# Patient Record
Sex: Female | Born: 1970 | Race: White | Hispanic: No | Marital: Married | Smoking: Former smoker
Health system: Southern US, Community
[De-identification: ages and names within clinical notes are randomized; demographics above are authoritative.]

## PROBLEM LIST (undated history)

## (undated) DIAGNOSIS — I10 Essential (primary) hypertension: Secondary | ICD-10-CM

## (undated) DIAGNOSIS — E663 Overweight: Secondary | ICD-10-CM

## (undated) DIAGNOSIS — E041 Nontoxic single thyroid nodule: Secondary | ICD-10-CM

## (undated) DIAGNOSIS — I4891 Unspecified atrial fibrillation: Secondary | ICD-10-CM

## (undated) DIAGNOSIS — I499 Cardiac arrhythmia, unspecified: Secondary | ICD-10-CM

## (undated) HISTORY — DX: Cardiac arrhythmia, unspecified: I49.9

## (undated) HISTORY — DX: Essential (primary) hypertension: I10

## (undated) HISTORY — DX: Overweight: E66.3

## (undated) HISTORY — DX: Nontoxic single thyroid nodule: E04.1

## (undated) HISTORY — PX: NO PAST SURGERIES: SHX2092

## (undated) HISTORY — DX: Unspecified atrial fibrillation: I48.91

---

## 2013-10-09 DIAGNOSIS — E041 Nontoxic single thyroid nodule: Secondary | ICD-10-CM

## 2013-10-09 HISTORY — DX: Nontoxic single thyroid nodule: E04.1

## 2013-11-07 ENCOUNTER — Ambulatory Visit: Payer: Self-pay | Admitting: Family Medicine

## 2014-01-27 LAB — HM PAP SMEAR: HM PAP: NORMAL

## 2014-11-09 ENCOUNTER — Encounter: Payer: Self-pay | Admitting: Family Medicine

## 2014-11-09 ENCOUNTER — Ambulatory Visit (INDEPENDENT_AMBULATORY_CARE_PROVIDER_SITE_OTHER): Payer: Managed Care, Other (non HMO) | Admitting: Family Medicine

## 2014-11-09 VITALS — BP 132/94 | HR 73 | Temp 98.8°F | Ht 68.0 in | Wt 192.0 lb

## 2014-11-09 DIAGNOSIS — E663 Overweight: Secondary | ICD-10-CM | POA: Diagnosis not present

## 2014-11-09 DIAGNOSIS — Z0000 Encounter for general adult medical examination without abnormal findings: Secondary | ICD-10-CM

## 2014-11-09 DIAGNOSIS — I10 Essential (primary) hypertension: Secondary | ICD-10-CM | POA: Diagnosis not present

## 2014-11-09 MED ORDER — HYDROCHLOROTHIAZIDE 12.5 MG PO CAPS: 12.5000 mg | ORAL_CAPSULE | Freq: Every day | ORAL | Status: DC

## 2014-11-09 NOTE — Progress Notes (Signed)
BP 132/94 mmHg  Pulse 73  Temp(Src) 98.8 F (37.1 C)  Ht 5\' 8"  (1.727 m)  Wt 192 lb (87.091 kg)  BMI 29.20 kg/m2  SpO2 99%  LMP 10/12/2014 (Approximate)   Subjective:    Patient ID: Courtney Walsh, female    DOB: [birth year], 44 y.o.   MRN: [id number]  HPI: Courtney Walsh is a 44 y.o. female  Chief Complaint  Patient presents with  . Form    Needs form for Biometric Screening.   She needs numbers for biometric screening The CMA did the waist circumference She just needs the information and she'll fill it out (she does not have a form here for me to sign) She is not fasting; had salad with raspberry dressing and string cheese and crackers She is trying to lose weight  Her BP is high today; she does check it at home; she has been out of her BP medicine for a while; she knew it would go up at the doctor's; her usual numbers are high 120s on top and high 80s or low 90s on the bottom Not taking allergy medicine; no much salt It's been a month since she has been on her medicine, just never made it in; we'll get that refill today She sees GYN for paps On her feet all day at work; has a movement monitor on wrist Not interested in flu shots  Relevant past medical, surgical, family and social history reviewed and updated as indicated. Interim medical history since our last visit reviewed. Allergies and medications reviewed and updated.  Review of Systems  Respiratory: Negative for shortness of breath.   Cardiovascular: Negative for chest pain and leg swelling.  Per HPI unless specifically indicated above     Objective:    BP 132/94 mmHg  Pulse 73  Temp(Src) 98.8 F (37.1 C)  Ht 5\' 8"  (1.727 m)  Wt 192 lb (87.091 kg)  BMI 29.20 kg/m2  SpO2 99%  LMP 10/12/2014 (Approximate)  Wt Readings from Last 3 Encounters:  11/09/14 192 lb (87.091 kg)  11/04/13 195 lb (88.451 kg)  BP recheck 132/94  Today's Vitals   11/09/14 1534 11/09/14 1608  BP: 152/93 132/94  Pulse: 76 73   Temp: 98.8 F (37.1 C)   Height: 5\' 8"  (1.727 m)   Weight: 192 lb (87.091 kg)   SpO2: 99%     Physical Exam  Constitutional: She appears well-developed and well-nourished.  overweight  HENT:  Mouth/Throat: Mucous membranes are normal.  Eyes: EOM are normal. No scleral icterus.  Cardiovascular: Normal rate and regular rhythm.   Pulmonary/Chest: Effort normal and breath sounds normal.  Psychiatric: She has a normal mood and affect. Her behavior is normal.      Assessment & Plan:   Problem List Items Addressed This Visit      Cardiovascular and Mediastinum   Essential hypertension, benign - Primary    DASH guidelines encouraged; modest weight loss; return in 4 weeks for recheck BP; see AVS      Relevant Medications   hydrochlorothiazide (MICROZIDE) 12.5 MG capsule     Other   Overweight (BMI 25.0-29.9)    Modest weight loss encouraged; see AVS       Other Visit Diagnoses    Preventative health care        labs ordered for biometric screening for work    Relevant Orders    Lipid Panel w/o Chol/HDL Ratio (Completed)    Comprehensive metabolic panel (Completed)  Follow up plan: Return in about 4 weeks (around 12/07/2014) for blood pressure.  She refused flu shot today  Meds ordered this encounter  Medications  . hydrochlorothiazide (MICROZIDE) 12.5 MG capsule    Sig: Take 1 capsule (12.5 mg total) by mouth daily.    Dispense:  30 capsule    Refill:  0   An after-visit summary was printed and given to the patient at check-out.  Please see the patient instructions which may contain other information and recommendations beyond what is mentioned above in the assessment and plan.

## 2014-11-09 NOTE — Patient Instructions (Addendum)
Your goal blood pressure is less than 140 mmHg on top and under 90 on the bottom Try to follow the DASH guidelines (DASH stands for Dietary Approaches to Stop Hypertension) Try to limit the sodium in your diet.  Ideally, consume less than 1.5 grams (less than 1,500mg ) per day. Do not add salt when cooking or at the table.  Check the sodium amount on labels when shopping, and choose items lower in sodium when given a choice. Avoid or limit foods that already contain a lot of sodium. Eat a diet rich in fruits and vegetables and whole grains.  Check out the information at familydoctor.org entitled "What It Takes to Lose Weight" Try to lose between 1-2 pounds per week by taking in fewer calories and burning off more calories You can succeed by limiting portions, limiting foods dense in calories and fat, becoming more active, and drinking 8 glasses of water a day (64 ounces) Don't skip meals, especially breakfast, as skipping meals may alter your metabolism Do not use over-the-counter weight loss pills or gimmicks that claim rapid weight loss A healthy BMI (or body mass index) is between 18.5 and 24.9 You can calculate your ideal BMI at the NIH website JobE[employer].huhttp://www.nhlbi.nih.gov/health/educational/lose_wt/BMI/bmicalc.htm  I do recommend yearly flu shots; for individuals who don't want flu shots, try to practice excellent hand hygiene, and avoid nursing homes, day cares, and hospitals during peak flu season; taking vitamin C daily during flu/cold season may help boost your immune system too   DASH Eating Plan DASH stands for "Dietary Approaches to Stop Hypertension." The DASH eating plan is a healthy eating plan that has been shown to reduce high blood pressure (hypertension). Additional health benefits may include reducing the risk of type 2 diabetes mellitus, heart disease, and stroke. The DASH eating plan may also help with weight loss. WHAT DO I NEED TO KNOW ABOUT THE DASH EATING PLAN? For the DASH  eating plan, you will follow these general guidelines:  Choose foods with a percent daily value for sodium of less than 5% (as listed on the food label).  Use salt-free seasonings or herbs instead of table salt or sea salt.  Check with your health care provider or pharmacist before using salt substitutes.  Eat lower-sodium products, often labeled as "lower sodium" or "no salt added."  Eat fresh foods.  Eat more vegetables, fruits, and low-fat dairy products.  Choose whole grains. Look for the word "whole" as the first word in the ingredient list.  Choose fish and skinless chicken or [place] more often than red meat. Limit fish, poultry, and meat to 6 oz (170 g) each day.  Limit sweets, desserts, sugars, and sugary drinks.  Choose heart-healthy fats.  Limit cheese to 1 oz (28 g) per day.  Eat more home-cooked food and less restaurant, buffet, and fast food.  Limit fried foods.  Cook foods using methods other than frying.  Limit canned vegetables. If you do use them, rinse them well to decrease the sodium.  When eating at a restaurant, ask that your food be prepared with less salt, or no salt if possible. WHAT FOODS CAN I EAT? Seek help from a dietitian for individual calorie needs. Grains Whole grain or whole wheat bread. Brown rice. Whole grain or whole wheat pasta. Quinoa, bulgur, and whole grain cereals. Low-sodium cereals. Corn or whole wheat flour tortillas. Whole grain cornbread. Whole grain crackers. Low-sodium crackers. Vegetables Fresh or frozen vegetables (raw, steamed, roasted, or grilled). Low-sodium or reduced-sodium tomato and vegetable  juices. Low-sodium or reduced-sodium tomato sauce and paste. Low-sodium or reduced-sodium canned vegetables.  Fruits All fresh, canned (in natural juice), or frozen fruits. Meat and Other Protein Products Ground beef (85% or leaner), grass-fed beef, or beef trimmed of fat. Skinless chicken or [place]. Ground chicken or [place]. Pork  trimmed of fat. All fish and seafood. Eggs. Dried beans, peas, or lentils. Unsalted nuts and seeds. Unsalted canned beans. Dairy Low-fat dairy products, such as skim or 1% milk, 2% or reduced-fat cheeses, low-fat ricotta or cottage cheese, or plain low-fat yogurt. Low-sodium or reduced-sodium cheeses. Fats and Oils Tub margarines without trans fats. Light or reduced-fat mayonnaise and salad dressings (reduced sodium). Avocado. Safflower, olive, or canola oils. Natural peanut or almond butter. Other Unsalted popcorn and pretzels. The items listed above may not be a complete list of recommended foods or beverages. Contact your dietitian for more options. WHAT FOODS ARE NOT RECOMMENDED? Grains White bread. White pasta. White rice. Refined cornbread. Bagels and croissants. Crackers that contain trans fat. Vegetables Creamed or fried vegetables. Vegetables in a cheese sauce. Regular canned vegetables. Regular canned tomato sauce and paste. Regular tomato and vegetable juices. Fruits Dried fruits. Canned fruit in light or heavy syrup. Fruit juice. Meat and Other Protein Products Fatty cuts of meat. Ribs, chicken wings, bacon, sausage, bologna, salami, chitterlings, fatback, hot dogs, bratwurst, and packaged luncheon meats. Salted nuts and seeds. Canned beans with salt. Dairy Whole or 2% milk, cream, half-and-half, and cream cheese. Whole-fat or sweetened yogurt. Full-fat cheeses or blue cheese. Nondairy creamers and whipped toppings. Processed cheese, cheese spreads, or cheese curds. Condiments Onion and garlic salt, seasoned salt, table salt, and sea salt. Canned and packaged gravies. Worcestershire sauce. Tartar sauce. Barbecue sauce. Teriyaki sauce. Soy sauce, including reduced sodium. Steak sauce. Fish sauce. Oyster sauce. Cocktail sauce. Horseradish. Ketchup and mustard. Meat flavorings and tenderizers. Bouillon cubes. Hot sauce. Tabasco sauce. Marinades. Taco seasonings. Relishes. Fats and  Oils Butter, stick margarine, lard, shortening, ghee, and bacon fat. Coconut, palm kernel, or palm oils. Regular salad dressings. Other Pickles and olives. Salted popcorn and pretzels. The items listed above may not be a complete list of foods and beverages to avoid. Contact your dietitian for more information. WHERE CAN I FIND MORE INFORMATION? National Heart, Lung, and Blood Institute: CablePromo.it   This information is not intended to replace advice given to you by your health care provider. Make sure you discuss any questions you have with your health care provider.   Document Released: 12/15/2010 Document Revised: 01/16/2014 Document Reviewed: 10/30/2012 Elsevier Interactive Patient Education [employer]

## 2014-11-10 ENCOUNTER — Encounter: Payer: Self-pay | Admitting: Family Medicine

## 2014-11-10 LAB — LIPID PANEL W/O CHOL/HDL RATIO
Cholesterol, Total: 172 mg/dL (ref 100–199)
HDL: 41 mg/dL (ref >39)
LDL CALC: 100 mg/dL — AB (ref 0–99)
TRIGLYCERIDES: 156 mg/dL — AB (ref 0–149)
VLDL Cholesterol Cal: 31 mg/dL (ref 5–40)

## 2014-11-10 LAB — COMPREHENSIVE METABOLIC PANEL
ALK PHOS: 105 IU/L (ref 39–117)
ALT: 18 IU/L (ref 0–32)
AST: 14 IU/L (ref 0–40)
Albumin/Globulin Ratio: 1.5 (ref 1.1–2.5)
Albumin: 4.3 g/dL (ref 3.5–5.5)
BILIRUBIN TOTAL: 0.4 mg/dL (ref 0.0–1.2)
BUN/Creatinine Ratio: 18 (ref 9–23)
BUN: 10 mg/dL (ref 6–24)
CHLORIDE: 102 mmol/L (ref 97–106)
CO2: 23 mmol/L (ref 18–29)
Calcium: 9.6 mg/dL (ref 8.7–10.2)
Creatinine, Ser: 0.57 mg/dL (ref 0.57–1.00)
GFR calc Af Amer: 129 mL/min/{1.73_m2} (ref >59)
GFR calc non Af Amer: 112 mL/min/{1.73_m2} (ref >59)
GLUCOSE: 81 mg/dL (ref 65–99)
Globulin, Total: 2.9 g/dL (ref 1.5–4.5)
Potassium: 4.6 mmol/L (ref 3.5–5.2)
Sodium: 140 mmol/L (ref 136–144)
Total Protein: 7.2 g/dL (ref 6.0–8.5)

## 2014-11-14 DIAGNOSIS — E669 Obesity, unspecified: Secondary | ICD-10-CM | POA: Insufficient documentation

## 2014-11-14 DIAGNOSIS — E663 Overweight: Secondary | ICD-10-CM | POA: Insufficient documentation

## 2014-11-14 NOTE — Assessment & Plan Note (Signed)
Modest weight loss encouraged; see AVS

## 2014-11-14 NOTE — Assessment & Plan Note (Signed)
DASH guidelines encouraged; modest weight loss; return in 4 weeks for recheck BP; see AVS

## 2014-12-08 ENCOUNTER — Ambulatory Visit: Payer: Managed Care, Other (non HMO) | Admitting: Family Medicine

## 2014-12-22 ENCOUNTER — Ambulatory Visit: Payer: Managed Care, Other (non HMO) | Admitting: Family Medicine

## 2014-12-24 ENCOUNTER — Encounter: Payer: Self-pay | Admitting: Family Medicine

## 2014-12-24 ENCOUNTER — Ambulatory Visit (INDEPENDENT_AMBULATORY_CARE_PROVIDER_SITE_OTHER): Payer: Managed Care, Other (non HMO) | Admitting: Family Medicine

## 2014-12-24 VITALS — BP 134/85 | HR 88 | Temp 97.9°F | Wt 192.0 lb

## 2014-12-24 DIAGNOSIS — E663 Overweight: Secondary | ICD-10-CM | POA: Diagnosis not present

## 2014-12-24 DIAGNOSIS — I10 Essential (primary) hypertension: Secondary | ICD-10-CM | POA: Diagnosis not present

## 2014-12-24 MED ORDER — HYDROCHLOROTHIAZIDE 25 MG PO TABS: 25.0000 mg | ORAL_TABLET | Freq: Every day | ORAL | Status: DC

## 2014-12-24 NOTE — Progress Notes (Signed)
BP 134/85 mmHg  Pulse 88  Temp(Src) 97.9 F (36.6 C)  Wt 192 lb (87.091 kg)  SpO2 98%  LMP 12/05/2014 (Exact Date)   Subjective:    Patient ID: Courtney Walsh, female    DOB: 05/23/1970, 44 y.o.   MRN: [id number]  HPI: Courtney Walsh is a 44 y.o. female  Chief Complaint  Patient presents with  . Hypertension    here for follow up and med refill   She is still having fluctuating numbers 128/89; highest that she remembers was 138/100; higher in the evenings Stress at work and rushing with family duties Not eating much in the way of salty foods Getting five hours of sleep at night This is the least stressful job she has had, but still lots to do She has no problems with the medicine No palpitations  Relevant past medical, surgical, family and social history reviewed and updated as indicated. Interim medical history since our last visit reviewed. Allergies and medications reviewed and updated.  Review of Systems Per HPI unless specifically indicated above     Objective:    BP 134/85 mmHg  Pulse 88  Temp(Src) 97.9 F (36.6 C)  Wt 192 lb (87.091 kg)  SpO2 98%  LMP 12/05/2014 (Exact Date)  Wt Readings from Last 3 Encounters:  12/24/14 192 lb (87.091 kg)  11/09/14 192 lb (87.091 kg)  11/04/13 195 lb (88.451 kg)  body mass index is 29.2 kg/(m^2).  Physical Exam  Constitutional: She appears well-developed and well-nourished.  Eyes: No scleral icterus.  Cardiovascular: Normal rate and regular rhythm.   Pulmonary/Chest: Effort normal and breath sounds normal.  Musculoskeletal: She exhibits no edema.  Skin: Skin is warm. No pallor.  Psychiatric: She has a normal mood and affect.   Results for orders placed or performed in visit on 11/09/14  Lipid Panel w/o Chol/HDL Ratio  Result Value Ref Range   Cholesterol, Total 172 100 - 199 mg/dL   Triglycerides 409156 (H) 0 - 149 mg/dL   HDL 41 >81>39 mg/dL   VLDL Cholesterol Cal 31 5 - 40 mg/dL   LDL Calculated 191100 (H) 0 - 99  mg/dL  Comprehensive metabolic panel  Result Value Ref Range   Glucose 81 65 - 99 mg/dL   BUN 10 6 - 24 mg/dL   Creatinine, Ser 4.780.57 0.57 - 1.00 mg/dL   GFR calc non Af Amer 112 >59 mL/min/1.73   GFR calc Af Amer 129 >59 mL/min/1.73   BUN/Creatinine Ratio 18 9 - 23   Sodium 140 136 - 144 mmol/L   Potassium 4.6 3.5 - 5.2 mmol/L   Chloride 102 97 - 106 mmol/L   CO2 23 18 - 29 mmol/L   Calcium 9.6 8.7 - 10.2 mg/dL   Total Protein 7.2 6.0 - 8.5 g/dL   Albumin 4.3 3.5 - 5.5 g/dL   Globulin, Total 2.9 1.5 - 4.5 g/dL   Albumin/Globulin Ratio 1.5 1.1 - 2.5   Bilirubin Total 0.4 0.0 - 1.2 mg/dL   Alkaline Phosphatase 105 39 - 117 IU/L   AST 14 0 - 40 IU/L   ALT 18 0 - 32 IU/L      Assessment & Plan:   Problem List Items Addressed This Visit      Cardiovascular and Mediastinum   Essential hypertension, benign - Primary    Not yet to goal; so glad that she has cut back on salt; used to cook with salt and now cooking with less; increase the HCTZ; she will check  her BP at home and just call with readings in about 2-3 weeks; watch for symptoms and eat banana or drink OJ if needed; if palpitations or leg cramps, will bring her in for BMP and Mg2+; avoid decongestants, NSAIDs; see AVS; DASH guidelines recommended      Relevant Medications   hydrochlorothiazide (HYDRODIURIL) 25 MG tablet     Other   Overweight (BMI 25.0-29.9)    Very modest weight loss would likely help BP         Follow up plan: Return in about 1 year (around 12/24/2015) for blood presure.  An after-visit summary was printed and given to the patient at check-out.  Please see the patient instructions which may contain other information and recommendations beyond what is mentioned above in the assessment and plan.  Medications Discontinued During This Encounter  Medication Reason  . hydrochlorothiazide (MICROZIDE) 12.5 MG capsule Dose change    Meds ordered this encounter  Medications  . hydrochlorothiazide  (HYDRODIURIL) 25 MG tablet    Sig: Take 1 tablet (25 mg total) by mouth daily.    Dispense:  90 tablet    Refill:  3

## 2014-12-24 NOTE — Assessment & Plan Note (Addendum)
Not yet to goal; so glad that she has cut back on salt; used to cook with salt and now cooking with less; increase the HCTZ; she will check her BP at home and just call with readings in about 2-3 weeks; watch for symptoms and eat banana or drink OJ if needed; if palpitations or leg cramps, will bring her in for BMP and Mg2+; avoid decongestants, NSAIDs; see AVS; DASH guidelines recommended

## 2014-12-24 NOTE — Patient Instructions (Signed)
Your goal blood pressure is less than 140 mmHg on top and less than 90 mmHg on the bottom Try to follow the DASH guidelines (DASH stands for Dietary Approaches to Stop Hypertension) Try to limit the sodium in your diet.  Ideally, consume less than 1.5 grams (less than 1,500mg ) per day. Do not add salt when cooking or at the table.  Check the sodium amount on labels when shopping, and choose items lower in sodium when given a choice. Avoid or limit foods that already contain a lot of sodium. Eat a diet rich in fruits and vegetables and whole grains. Try to use PLAIN allergy medicine without the decongestant Avoid: phenylephrine, phenylpropanolamine, and pseudoephredine If you need something for aches or pains, try to use Tylenol (acetaminphen) instead of non-steroidals (which include Aleve, ibuprofen, Advil, Motrin, and naproxen); non-steroidals can cause long-term kidney damage and raise blood pressure Increase the HCTZ to 25 mg daily Call with any issues Return for physical when needed

## 2014-12-25 ENCOUNTER — Ambulatory Visit: Payer: Managed Care, Other (non HMO) | Admitting: Family Medicine

## 2014-12-26 NOTE — Assessment & Plan Note (Signed)
Very modest weight loss would likely help BP

## 2015-03-17 ENCOUNTER — Telehealth: Payer: Self-pay | Admitting: Family Medicine

## 2015-03-17 NOTE — Telephone Encounter (Signed)
Left message to call.

## 2015-03-17 NOTE — Telephone Encounter (Signed)
We received a note from [place] and wanted to see if she's having any problems with her BP pill Find out if side effects or she quit taking it, etc. We just want to see if there's anything we can do to help her control her BP Thanks

## 2015-03-19 NOTE — Telephone Encounter (Signed)
Spoke with patient, she states she is taking it everyday with no issues. She says she is on automatic refills.

## 2015-03-19 NOTE — Telephone Encounter (Signed)
Left message to call.

## 2015-05-26 ENCOUNTER — Other Ambulatory Visit: Payer: Self-pay | Admitting: Obstetrics and Gynecology

## 2015-05-26 DIAGNOSIS — Z1231 Encounter for screening mammogram for malignant neoplasm of breast: Secondary | ICD-10-CM

## 2015-10-12 ENCOUNTER — Encounter: Payer: Self-pay | Admitting: Unknown Physician Specialty

## 2015-10-12 ENCOUNTER — Ambulatory Visit (INDEPENDENT_AMBULATORY_CARE_PROVIDER_SITE_OTHER): Payer: Managed Care, Other (non HMO) | Admitting: Unknown Physician Specialty

## 2015-10-12 VITALS — BP 129/84 | HR 83 | Temp 98.5°F | Ht 69.2 in | Wt 189.4 lb

## 2015-10-12 DIAGNOSIS — I499 Cardiac arrhythmia, unspecified: Secondary | ICD-10-CM | POA: Insufficient documentation

## 2015-10-12 DIAGNOSIS — I10 Essential (primary) hypertension: Secondary | ICD-10-CM | POA: Diagnosis not present

## 2015-10-12 DIAGNOSIS — R002 Palpitations: Secondary | ICD-10-CM

## 2015-10-12 NOTE — Assessment & Plan Note (Signed)
Stable with SBP 126 on recheck.  Continue present medications.

## 2015-10-12 NOTE — Assessment & Plan Note (Signed)
Normal rhythm on EKG.  Asymptomatic.  Suspect PVCs or PACs.  Recheck 6 months

## 2015-10-12 NOTE — Progress Notes (Signed)
BP 129/84 (BP Location: Left Arm, Cuff Size: Large)   Pulse 83   Temp 98.5 F (36.9 C)   Ht 5' 9.2" (1.758 m)   Wt 189 lb 6.4 oz (85.9 kg)   LMP 10/06/2015 (Exact Date)   SpO2 99%   BMI 27.81 kg/m    Subjective:    Patient ID: Courtney Walsh, female    DOB: Jul 24, 1970, 45 y.o.   MRN: [id number]  HPI: Courtney Walsh is a 45 y.o. female  Chief Complaint  Patient presents with  . BIOMETRIC SCREENING   Hypertension Using medications without difficulty Average home BPs   No problems or lightheadedness No chest pain with exertion or shortness of breath No Edema  She has a gyn check and told she had an irregular heart beat.  No symptoms of palpitations.     Relevant past medical, surgical, family and social history reviewed and updated as indicated. Interim medical history since our last visit reviewed. Allergies and medications reviewed and updated.  Review of Systems  Constitutional: Negative.   HENT: Negative.   Eyes: Negative.   Respiratory: Negative.   Cardiovascular: Negative.   Gastrointestinal: Negative.   Endocrine: Negative.   Genitourinary: Negative.   Musculoskeletal: Negative.   Skin: Negative.   Allergic/Immunologic: Negative.   Neurological: Negative.   Hematological: Negative.   Psychiatric/Behavioral: Negative.     Per HPI unless specifically indicated above     Objective:    BP 129/84 (BP Location: Left Arm, Cuff Size: Large)   Pulse 83   Temp 98.5 F (36.9 C)   Ht 5' 9.2" (1.758 m)   Wt 189 lb 6.4 oz (85.9 kg)   LMP 10/06/2015 (Exact Date)   SpO2 99%   BMI 27.81 kg/m   Wt Readings from Last 3 Encounters:  10/12/15 189 lb 6.4 oz (85.9 kg)  12/24/14 192 lb (87.1 kg)  11/09/14 192 lb (87.1 kg)    Physical Exam  Constitutional: She is oriented to person, place, and time. She appears well-developed and well-nourished. No distress.  HENT:  Head: Normocephalic and atraumatic.  Eyes: Conjunctivae and lids are normal. Pupils are  equal, round, and reactive to light. Right eye exhibits no discharge. Left eye exhibits no discharge. No scleral icterus.  Neck: Normal range of motion. Neck supple. No JVD present. Carotid bruit is not present. No thyromegaly present.  Cardiovascular: Normal rate, regular rhythm and normal heart sounds.  Exam reveals no gallop and no friction rub.   No murmur heard. Pulmonary/Chest: Effort normal and breath sounds normal. No respiratory distress. She has no wheezes. She has no rales.  Abdominal: Soft. Normal appearance and bowel sounds are normal. There is no splenomegaly or hepatomegaly. There is no tenderness. There is no rebound.  Genitourinary: No breast swelling, tenderness or discharge.  Musculoskeletal: Normal range of motion.  Lymphadenopathy:    She has no cervical adenopathy.  Neurological: She is alert and oriented to person, place, and time.  Skin: Skin is warm, dry and intact. No rash noted. No pallor.  Psychiatric: She has a normal mood and affect. Her speech is normal and behavior is normal. Judgment and thought content normal. Cognition and memory are normal.   EKG is normal Results for orders placed or performed in visit on 11/09/14  Lipid Panel w/o Chol/HDL Ratio  Result Value Ref Range   Cholesterol, Total 172 100 - 199 mg/dL   Triglycerides 409156 (H) 0 - 149 mg/dL   HDL 41 >81>39 mg/dL   VLDL  Cholesterol Cal 31 5 - 40 mg/dL   LDL Calculated 161 (H) 0 - 99 mg/dL  Comprehensive metabolic panel  Result Value Ref Range   Glucose 81 65 - 99 mg/dL   BUN 10 6 - 24 mg/dL   Creatinine, Ser 0.96 0.57 - 1.00 mg/dL   GFR calc non Af Amer 112 >59 mL/min/1.73   GFR calc Af Amer 129 >59 mL/min/1.73   BUN/Creatinine Ratio 18 9 - 23   Sodium 140 136 - 144 mmol/L   Potassium 4.6 3.5 - 5.2 mmol/L   Chloride 102 97 - 106 mmol/L   CO2 23 18 - 29 mmol/L   Calcium 9.6 8.7 - 10.2 mg/dL   Total Protein 7.2 6.0 - 8.5 g/dL   Albumin 4.3 3.5 - 5.5 g/dL   Globulin, Total 2.9 1.5 - 4.5 g/dL     Albumin/Globulin Ratio 1.5 1.1 - 2.5   Bilirubin Total 0.4 0.0 - 1.2 mg/dL   Alkaline Phosphatase 105 39 - 117 IU/L   AST 14 0 - 40 IU/L   ALT 18 0 - 32 IU/L      Assessment & Plan:   Problem List Items Addressed This Visit      Unprioritized   Essential hypertension, benign    Stable with SBP 126 on recheck.  Continue present medications.        Relevant Orders   Comprehensive metabolic panel   Lipid Panel w/o Chol/HDL Ratio   Irregular heartbeat - Primary    Normal rhythm on EKG.  Asymptomatic.  Suspect PVCs or PACs.  Recheck 6 months      Relevant Orders   EKG 12-Lead (Completed)   TSH    Other Visit Diagnoses    Palpitation       Relevant Orders   TSH   CBC with Differential/Platelet       Follow up plan: Return in about 6 months (around 04/11/2016).

## 2015-10-13 LAB — CBC WITH DIFFERENTIAL/PLATELET
BASOS ABS: 0 10*3/uL (ref 0.0–0.2)
Basos: 1 %
EOS (ABSOLUTE): 0.1 10*3/uL (ref 0.0–0.4)
Eos: 1 %
Hematocrit: 43.7 % (ref 34.0–46.6)
Hemoglobin: 14.6 g/dL (ref 11.1–15.9)
IMMATURE GRANULOCYTES: 0 %
Immature Grans (Abs): 0 10*3/uL (ref 0.0–0.1)
LYMPHS: 27 %
Lymphocytes Absolute: 2.3 10*3/uL (ref 0.7–3.1)
MCH: 29.3 pg (ref 26.6–33.0)
MCHC: 33.4 g/dL (ref 31.5–35.7)
MCV: 88 fL (ref 79–97)
MONOS ABS: 0.6 10*3/uL (ref 0.1–0.9)
Monocytes: 8 %
NEUTROS PCT: 63 %
Neutrophils Absolute: 5.5 10*3/uL (ref 1.4–7.0)
PLATELETS: 248 10*3/uL (ref 150–379)
RBC: 4.99 x10E6/uL (ref 3.77–5.28)
RDW: 13.8 % (ref 12.3–15.4)
WBC: 8.5 10*3/uL (ref 3.4–10.8)

## 2015-10-13 LAB — LIPID PANEL W/O CHOL/HDL RATIO
CHOLESTEROL TOTAL: 166 mg/dL (ref 100–199)
HDL: 48 mg/dL (ref >39)
LDL Calculated: 96 mg/dL (ref 0–99)
TRIGLYCERIDES: 111 mg/dL (ref 0–149)
VLDL Cholesterol Cal: 22 mg/dL (ref 5–40)

## 2015-10-13 LAB — COMPREHENSIVE METABOLIC PANEL
ALK PHOS: 116 IU/L (ref 39–117)
ALT: 17 IU/L (ref 0–32)
AST: 17 IU/L (ref 0–40)
Albumin/Globulin Ratio: 1.3 (ref 1.2–2.2)
Albumin: 4.2 g/dL (ref 3.5–5.5)
BILIRUBIN TOTAL: 0.8 mg/dL (ref 0.0–1.2)
BUN/Creatinine Ratio: 17 (ref 9–23)
BUN: 10 mg/dL (ref 6–24)
CHLORIDE: 96 mmol/L (ref 96–106)
CO2: 28 mmol/L (ref 18–29)
CREATININE: 0.6 mg/dL (ref 0.57–1.00)
Calcium: 10.1 mg/dL (ref 8.7–10.2)
GFR calc Af Amer: 126 mL/min/{1.73_m2} (ref >59)
GFR calc non Af Amer: 109 mL/min/{1.73_m2} (ref >59)
GLUCOSE: 76 mg/dL (ref 65–99)
Globulin, Total: 3.2 g/dL (ref 1.5–4.5)
Potassium: 4.6 mmol/L (ref 3.5–5.2)
Sodium: 140 mmol/L (ref 134–144)
TOTAL PROTEIN: 7.4 g/dL (ref 6.0–8.5)

## 2015-10-13 LAB — TSH: TSH: 1.95 u[IU]/mL (ref 0.450–4.500)

## 2015-10-26 ENCOUNTER — Ambulatory Visit: Payer: Managed Care, Other (non HMO) | Admitting: Family Medicine

## 2015-12-08 ENCOUNTER — Other Ambulatory Visit: Payer: Self-pay | Admitting: Family Medicine

## 2016-04-04 ENCOUNTER — Other Ambulatory Visit: Payer: Self-pay | Admitting: Unknown Physician Specialty

## 2016-04-04 MED ORDER — HYDROCHLOROTHIAZIDE 25 MG PO TABS: 25.0000 mg | ORAL_TABLET | Freq: Every day | ORAL | 3 refills | Status: DC

## 2016-04-04 NOTE — Telephone Encounter (Signed)
Called and left patient a VM letting her know that her prescription was sent in to her pharmacy for her.

## 2016-04-04 NOTE — Telephone Encounter (Signed)
Routing to provider  

## 2016-04-04 NOTE — Telephone Encounter (Signed)
Pt would like a refill for hydrochlorothiazide (HYDRODIURIL) 25 MG tablet sent to [facility].

## 2016-04-11 ENCOUNTER — Ambulatory Visit: Payer: Managed Care, Other (non HMO) | Admitting: Unknown Physician Specialty

## 2016-06-19 ENCOUNTER — Other Ambulatory Visit: Payer: Self-pay | Admitting: Obstetrics and Gynecology

## 2017-01-23 ENCOUNTER — Ambulatory Visit (INDEPENDENT_AMBULATORY_CARE_PROVIDER_SITE_OTHER): Payer: Managed Care, Other (non HMO) | Admitting: Obstetrics and Gynecology

## 2017-01-23 ENCOUNTER — Encounter: Payer: Self-pay | Admitting: Unknown Physician Specialty

## 2017-01-23 ENCOUNTER — Ambulatory Visit (INDEPENDENT_AMBULATORY_CARE_PROVIDER_SITE_OTHER): Payer: Managed Care, Other (non HMO) | Admitting: Unknown Physician Specialty

## 2017-01-23 ENCOUNTER — Encounter: Payer: Self-pay | Admitting: Obstetrics and Gynecology

## 2017-01-23 VITALS — BP 134/86 | HR 81 | Temp 98.5°F | Wt 202.4 lb

## 2017-01-23 VITALS — BP 126/74 | Ht 69.0 in | Wt 201.0 lb

## 2017-01-23 DIAGNOSIS — Z1339 Encounter for screening examination for other mental health and behavioral disorders: Secondary | ICD-10-CM | POA: Diagnosis not present

## 2017-01-23 DIAGNOSIS — Z01419 Encounter for gynecological examination (general) (routine) without abnormal findings: Secondary | ICD-10-CM

## 2017-01-23 DIAGNOSIS — Z124 Encounter for screening for malignant neoplasm of cervix: Secondary | ICD-10-CM

## 2017-01-23 DIAGNOSIS — Z1331 Encounter for screening for depression: Secondary | ICD-10-CM | POA: Diagnosis not present

## 2017-01-23 DIAGNOSIS — Z0000 Encounter for general adult medical examination without abnormal findings: Secondary | ICD-10-CM | POA: Diagnosis not present

## 2017-01-23 DIAGNOSIS — I10 Essential (primary) hypertension: Secondary | ICD-10-CM

## 2017-01-23 DIAGNOSIS — Z3041 Encounter for surveillance of contraceptive pills: Secondary | ICD-10-CM

## 2017-01-23 MED ORDER — HYDROCHLOROTHIAZIDE 25 MG PO TABS: 25.0000 mg | ORAL_TABLET | Freq: Every day | ORAL | 3 refills | Status: DC

## 2017-01-23 MED ORDER — NORETHINDRONE 0.35 MG PO TABS: 1.0000 | ORAL_TABLET | Freq: Every day | ORAL | 3 refills | Status: DC

## 2017-01-23 NOTE — Progress Notes (Signed)
Gynecology Annual Exam   PCP: Patient, No Pcp Per  Chief Complaint  Patient presents with  . Annual Exam   History of Present Illness:  Ms. Zonia Caplin is a 47 y.o. [id number] who LMP was Patient's last menstrual period was 01/09/2017., presents today for her annual examination.  Her menses are regular every 28-30 days, lasting 6 day(s).  Dysmenorrhea none. She does not have intermenstrual bleeding.  She does not have vasomotor symptoms.  She is single partner, contraception - oral progesterone-only contraceptive. She does not have vaginal dryness.  Last Pap: 3 years ago  Results were: no abnormalities /neg HPV DNA.  Hx of STDs: none  Last mammogram: 2 years ago  Results were: normal--routine follow-up in 12 months There is no FH of breast cancer. There is no FH of ovarian cancer. The patient does not do self-breast exams.  Colonoscopy: not due DEXA: has not been screened for osteoporosis  Tobacco use: The patient denies current or previous tobacco use. Alcohol use: social drinker Exercise: not active  The patient wears seatbelts: yes.     Past Medical History:  Diagnosis Date  . Hypertension   . Irregular heart beat   . Overweight   . Thyroid cyst October 2015   Left lobe-6 mm.   Past Surgical History: denies  Medications   Medication Sig Start Date End Date Taking? Authorizing Provider  hydrochlorothiazide (HYDRODIURIL) 25 MG tablet Take 1 tablet (25 mg total) by mouth daily. 04/04/16  Yes Gabriel Cirri, NP  norethindrone (MICRONOR,CAMILA,ERRIN) 0.35 MG tablet take 1 tablet by mouth once daily 06/19/16  Yes Conard Novak, MD    Allergies: No Known Allergies  Obstetric History: [id number], s/p SVD x 4  Family History  Problem Relation Age of Onset  . Hypertension Mother   . Hypertension Father   . Stroke Father   . Heart disease Maternal Grandfather   . Heart disease Paternal Grandmother   . Hypertension Sister   . Cancer Neg Hx   . Diabetes Neg Hx     . COPD Neg Hx     Social History   Socioeconomic History  . Marital status: Married    Spouse name: Not on file  . Number of children: Not on file  . Years of education: Not on file  . Highest education level: Not on file  Social Needs  . Financial resource strain: Not on file  . Food insecurity - worry: Not on file  . Food insecurity - inability: Not on file  . Transportation needs - medical: Not on file  . Transportation needs - non-medical: Not on file  Occupational History  . Not on file  Tobacco Use  . Smoking status: Never Smoker  . Smokeless tobacco: Never Used  Substance and Sexual Activity  . Alcohol use: No  . Drug use: No  . Sexual activity: Yes    Birth control/protection: Pill  Other Topics Concern  . Not on file  Social History Narrative  . Not on file    Review of Systems  Constitutional: Negative.   HENT: Negative.   Eyes: Negative.   Respiratory: Negative.   Cardiovascular: Negative.   Gastrointestinal: Negative.   Genitourinary: Negative.   Musculoskeletal: Negative.   Skin: Negative.   Neurological: Negative.   Psychiatric/Behavioral: Negative.     Physical Exam BP 126/74   Ht 5\' 9"  (1.753 m)   Wt 201 lb (91.2 kg)   LMP 01/09/2017   BMI 29.68 kg/m   Physical  Exam  Constitutional: She is oriented to person, place, and time. She appears well-developed and well-nourished. No distress.  Genitourinary: Uterus normal. Pelvic exam was performed with patient supine. There is no rash, tenderness, lesion or injury on the right labia. There is no rash, tenderness, lesion or injury on the left labia. No erythema, tenderness or bleeding in the vagina. No signs of injury around the vagina. No vaginal discharge found. Right adnexum does not display mass, does not display tenderness and does not display fullness. Left adnexum does not display mass, does not display tenderness and does not display fullness. Cervix does not exhibit motion tenderness, lesion,  discharge or polyp.   Uterus is mobile and anteverted. Uterus is not enlarged, tender or exhibiting a mass.  HENT:  Head: Normocephalic and atraumatic.  Eyes: EOM are normal. No scleral icterus.  Neck: Normal range of motion. Neck supple. No thyromegaly present.  Cardiovascular: Normal rate and regular rhythm. Exam reveals no gallop and no friction rub.  No murmur heard. Pulmonary/Chest: Effort normal and breath sounds normal. No respiratory distress. She has no wheezes. She has no rales. Right breast exhibits no inverted nipple, no mass, no nipple discharge, no skin change and no tenderness. Left breast exhibits no inverted nipple, no mass, no nipple discharge, no skin change and no tenderness.  Abdominal: Soft. Bowel sounds are normal. She exhibits no distension and no mass. There is no tenderness. There is no rebound and no guarding.  Musculoskeletal: Normal range of motion. She exhibits no edema or tenderness.  Lymphadenopathy:    She has no cervical adenopathy.       Right: No inguinal adenopathy present.       Left: No inguinal adenopathy present.  Neurological: She is alert and oriented to person, place, and time. No cranial nerve deficit.  Skin: Skin is warm and dry. No rash noted. No erythema.  Psychiatric: She has a normal mood and affect. Her behavior is normal. Judgment normal.   Female chaperone present for pelvic and breast  portions of the physical exam  Results: AUDIT Questionnaire (screen for alcoholism): 1 PHQ-9: 0  Assessment: 47 y.o. [id number] female here for routine gynecologic examination.  Plan: Problem List Items Addressed This Visit    None    Visit Diagnoses    Women's annual routine gynecological examination    -  Primary   Relevant Medications   norethindrone (MICRONOR,CAMILA,ERRIN) 0.35 MG tablet   Other Relevant Orders   IGP, Aptima HPV, rfx 16/18,45   Screening for depression       Screening for alcoholism       Pap smear for cervical cancer  screening       Relevant Orders   IGP, Aptima HPV, rfx 16/18,45   Encounter for surveillance of contraceptive pills       Relevant Medications   norethindrone (MICRONOR,CAMILA,ERRIN) 0.35 MG tablet     Screening: -- Blood pressure screen HTN managed by PCP -- Colonoscopy - not due -- Mammogram - due. Patient to call [facility] to arrange. She understands that it is her responsibility to arrange this. -- Weight screening: overweight: continue to monitor -- Depression screening negative (PHQ-9) -- Nutrition: normal -- cholesterol screening: per PCP -- osteoporosis screening: not due -- tobacco screening: not using -- alcohol screening: AUDIT questionnaire indicates low-risk usage. -- family history of breast cancer screening: done. not at high risk. -- no evidence of domestic violence or intimate partner violence. -- STD screening: gonorrhea/chlamydia NAAT not collected per patient  request. -- pap smear collected per ASCCP guidelines -- flu vaccine declines -- HPV vaccination series: not eligilbe  Thomasene Mohair, MD 01/23/2017 8:44 AM

## 2017-01-23 NOTE — Assessment & Plan Note (Signed)
Stable, continue present medications.   

## 2017-01-23 NOTE — Progress Notes (Signed)
BP 134/86   Pulse 81   Temp 98.5 F (36.9 C) (Oral)   Wt 202 lb 6.4 oz (91.8 kg)   LMP 01/09/2017   SpO2 98%   BMI 29.89 kg/m    Subjective:    Patient ID: Courtney Walsh, female    DOB: [birth year], 47 y.o.   MRN: [id number]  HPI: Courtney Walsh is a 47 y.o. female  Chief Complaint  Patient presents with  . Annual Exam    pt saw OB this morning and had PAP done at that appointment    Hypertension Using medications without difficulty Average home BPs "average"   No problems or lightheadedness No chest pain with exertion or shortness of breath No Edema  Depression screen PHQ 2/9 01/23/2017  Decreased Interest 0  PHQ - 2 Score 0     Relevant past medical, surgical, family and social history reviewed and updated as indicated. Interim medical history since our last visit reviewed. Allergies and medications reviewed and updated.  Review of Systems  Constitutional: Negative.   HENT: Negative.   Eyes: Negative.   Respiratory: Negative.   Cardiovascular: Negative.   Gastrointestinal: Negative.   Endocrine: Negative.   Genitourinary: Negative.   Musculoskeletal: Negative.   Skin: Negative.   Allergic/Immunologic: Negative.   Neurological: Negative.   Hematological: Negative.   Psychiatric/Behavioral: Negative.     Per HPI unless specifically indicated above     Objective:    BP 134/86   Pulse 81   Temp 98.5 F (36.9 C) (Oral)   Wt 202 lb 6.4 oz (91.8 kg)   LMP 01/09/2017   SpO2 98%   BMI 29.89 kg/m   Wt Readings from Last 3 Encounters:  01/23/17 202 lb 6.4 oz (91.8 kg)  01/23/17 201 lb (91.2 kg)  10/12/15 189 lb 6.4 oz (85.9 kg)    Physical Exam  Constitutional: She is oriented to person, place, and time. She appears well-developed and well-nourished. No distress.  HENT:  Head: Normocephalic and atraumatic.  Eyes: Conjunctivae and lids are normal. Right eye exhibits no discharge. Left eye exhibits no discharge. No scleral icterus.  Neck:  Normal range of motion. Neck supple. No JVD present. Carotid bruit is not present.  Cardiovascular: Normal rate, regular rhythm and normal heart sounds.  Pulmonary/Chest: Effort normal and breath sounds normal.  Abdominal: Soft. Normal appearance. She exhibits no distension and no mass. There is no splenomegaly or hepatomegaly. There is no tenderness. There is no rebound and no guarding.  Musculoskeletal: Normal range of motion.  Neurological: She is alert and oriented to person, place, and time.  Skin: Skin is warm, dry and intact. No rash noted. No pallor.  Psychiatric: She has a normal mood and affect. Her behavior is normal. Judgment and thought content normal.    Results for orders placed or performed in visit on 01/23/17  HM PAP SMEAR  Result Value Ref Range   HM Pap smear normal       Assessment & Plan:   Problem List Items Addressed This Visit      Unprioritized   Essential hypertension, benign    Stable, continue present medications.        Relevant Medications   hydrochlorothiazide (HYDRODIURIL) 25 MG tablet   Other Relevant Orders   Comprehensive metabolic panel   Lipid Panel w/o Chol/HDL Ratio   CBC with Differential/Platelet   TSH    Other Visit Diagnoses    Wellness examination    -  Primary   Relevant  Orders   Comprehensive metabolic panel   Lipid Panel w/o Chol/HDL Ratio   CBC with Differential/Platelet   TSH   Annual physical exam           Follow up plan: Return in about 1 year (around 01/23/2018).

## 2017-01-23 NOTE — Patient Instructions (Addendum)
Preventive Care 40-64 Years, Female Preventive care refers to lifestyle choices and visits with your health care provider that can promote health and wellness. What does preventive care include?  A yearly physical exam. This is also called an annual well check.  Dental exams once or twice a year.  Routine eye exams. Ask your health care provider how often you should have your eyes checked.  Personal lifestyle choices, including: ? Daily care of your teeth and gums. ? Regular physical activity. ? Eating a healthy diet. ? Avoiding tobacco and drug use. ? Limiting alcohol use. ? Practicing safe sex. ? Taking low-dose aspirin daily starting at age 58. ? Taking vitamin and mineral supplements as recommended by your health care provider. What happens during an annual well check? The services and screenings done by your health care provider during your annual well check will depend on your age, overall health, lifestyle risk factors, and family history of disease. Counseling Your health care provider may ask you questions about your:  Alcohol use.  Tobacco use.  Drug use.  Emotional well-being.  Home and relationship well-being.  Sexual activity.  Eating habits.  Work and work Statistician.  Method of birth control.  Menstrual cycle.  Pregnancy history.  Screening You may have the following tests or measurements:  Height, weight, and BMI.  Blood pressure.  Lipid and cholesterol levels. These may be checked every 5 years, or more frequently if you are over 81 years old.  Skin check.  Lung cancer screening. You may have this screening every year starting at age 78 if you have a 30-pack-year history of smoking and currently smoke or have quit within the past 15 years.  Fecal occult blood test (FOBT) of the stool. You may have this test every year starting at age 65.  Flexible sigmoidoscopy or colonoscopy. You may have a sigmoidoscopy every 5 years or a colonoscopy  every 10 years starting at age 30.  Hepatitis C blood test.  Hepatitis B blood test.  Sexually transmitted disease (STD) testing.  Diabetes screening. This is done by checking your blood sugar (glucose) after you have not eaten for a while (fasting). You may have this done every 1-3 years.  Mammogram. This may be done every 1-2 years. Talk to your health care provider about when you should start having regular mammograms. This may depend on whether you have a family history of breast cancer.  BRCA-related cancer screening. This may be done if you have a family history of breast, ovarian, tubal, or peritoneal cancers.  Pelvic exam and Pap test. This may be done every 3 years starting at age 80. Starting at age 36, this may be done every 5 years if you have a Pap test in combination with an HPV test.  Bone density scan. This is done to screen for osteoporosis. You may have this scan if you are at high risk for osteoporosis.  Discuss your test results, treatment options, and if necessary, the need for more tests with your health care provider. Vaccines Your health care provider may recommend certain vaccines, such as:  Influenza vaccine. This is recommended every year.  Tetanus, diphtheria, and acellular pertussis (Tdap, Td) vaccine. You may need a Td booster every 10 years.  Varicella vaccine. You may need this if you have not been vaccinated.  Zoster vaccine. You may need this after age 5.  Measles, mumps, and rubella (MMR) vaccine. You may need at least one dose of MMR if you were born in  1959 or later. You may also need a second dose.  Pneumococcal 13-valent conjugate (PCV13) vaccine. You may need this if you have certain conditions and were not previously vaccinated.  Pneumococcal polysaccharide (PPSV23) vaccine. You may need one or two doses if you smoke cigarettes or if you have certain conditions.  Meningococcal vaccine. You may need this if you have certain  conditions.  Hepatitis A vaccine. You may need this if you have certain conditions or if you travel or work in places where you may be exposed to hepatitis A.  Hepatitis B vaccine. You may need this if you have certain conditions or if you travel or work in places where you may be exposed to hepatitis B.  Haemophilus influenzae type b (Hib) vaccine. You may need this if you have certain conditions.  Talk to your health care provider about which screenings and vaccines you need and how often you need them. This information is not intended to replace advice given to you by your health care provider. Make sure you discuss any questions you have with your health care provider. Document Released: 01/22/2015 Document Revised: 09/15/2015 Document Reviewed: 10/27/2014 Elsevier Interactive Patient Education  2018 [employer]  

## 2017-01-24 LAB — COMPREHENSIVE METABOLIC PANEL
A/G RATIO: 1.3 (ref 1.2–2.2)
ALT: 28 IU/L (ref 0–32)
AST: 24 IU/L (ref 0–40)
Albumin: 4.3 g/dL (ref 3.5–5.5)
Alkaline Phosphatase: 124 IU/L — ABNORMAL HIGH (ref 39–117)
BUN/Creatinine Ratio: 18 (ref 9–23)
BUN: 10 mg/dL (ref 6–24)
Bilirubin Total: 0.6 mg/dL (ref 0.0–1.2)
CO2: 29 mmol/L (ref 20–29)
Calcium: 10 mg/dL (ref 8.7–10.2)
Chloride: 97 mmol/L (ref 96–106)
Creatinine, Ser: 0.56 mg/dL — ABNORMAL LOW (ref 0.57–1.00)
GFR calc non Af Amer: 111 mL/min/{1.73_m2} (ref >59)
GFR, EST AFRICAN AMERICAN: 128 mL/min/{1.73_m2} (ref >59)
Globulin, Total: 3.2 g/dL (ref 1.5–4.5)
Glucose: 87 mg/dL (ref 65–99)
POTASSIUM: 4.5 mmol/L (ref 3.5–5.2)
SODIUM: 141 mmol/L (ref 134–144)
Total Protein: 7.5 g/dL (ref 6.0–8.5)

## 2017-01-24 LAB — CBC WITH DIFFERENTIAL/PLATELET
BASOS: 0 %
Basophils Absolute: 0 10*3/uL (ref 0.0–0.2)
EOS (ABSOLUTE): 0.1 10*3/uL (ref 0.0–0.4)
Eos: 1 %
HEMATOCRIT: 44.9 % (ref 34.0–46.6)
HEMOGLOBIN: 15.1 g/dL (ref 11.1–15.9)
IMMATURE GRANULOCYTES: 0 %
Immature Grans (Abs): 0 10*3/uL (ref 0.0–0.1)
Lymphocytes Absolute: 2.7 10*3/uL (ref 0.7–3.1)
Lymphs: 28 %
MCH: 29.8 pg (ref 26.6–33.0)
MCHC: 33.6 g/dL (ref 31.5–35.7)
MCV: 89 fL (ref 79–97)
MONOCYTES: 8 %
MONOS ABS: 0.8 10*3/uL (ref 0.1–0.9)
NEUTROS PCT: 63 %
Neutrophils Absolute: 5.9 10*3/uL (ref 1.4–7.0)
PLATELETS: 239 10*3/uL (ref 150–379)
RBC: 5.07 x10E6/uL (ref 3.77–5.28)
RDW: 13.4 % (ref 12.3–15.4)
WBC: 9.5 10*3/uL (ref 3.4–10.8)

## 2017-01-24 LAB — LIPID PANEL W/O CHOL/HDL RATIO
Cholesterol, Total: 179 mg/dL (ref 100–199)
HDL: 43 mg/dL (ref >39)
LDL Calculated: 84 mg/dL (ref 0–99)
TRIGLYCERIDES: 258 mg/dL — AB (ref 0–149)
VLDL Cholesterol Cal: 52 mg/dL — ABNORMAL HIGH (ref 5–40)

## 2017-01-24 LAB — TSH: TSH: 1.86 u[IU]/mL (ref 0.450–4.500)

## 2017-01-24 NOTE — Progress Notes (Signed)
Notified pt by mychart

## 2017-01-25 LAB — IGP, APTIMA HPV, RFX 16/18,45
HPV APTIMA: NEGATIVE
PAP Smear Comment: 0

## 2018-02-20 ENCOUNTER — Ambulatory Visit: Payer: Managed Care, Other (non HMO) | Admitting: Obstetrics and Gynecology

## 2018-03-06 ENCOUNTER — Encounter: Payer: Self-pay | Admitting: Obstetrics and Gynecology

## 2018-03-06 ENCOUNTER — Ambulatory Visit (INDEPENDENT_AMBULATORY_CARE_PROVIDER_SITE_OTHER): Payer: 59 | Admitting: Obstetrics and Gynecology

## 2018-03-06 VITALS — BP 146/100 | Ht 69.0 in | Wt 200.0 lb

## 2018-03-06 DIAGNOSIS — I498 Other specified cardiac arrhythmias: Secondary | ICD-10-CM

## 2018-03-06 DIAGNOSIS — N763 Subacute and chronic vulvitis: Secondary | ICD-10-CM

## 2018-03-06 DIAGNOSIS — Z1339 Encounter for screening examination for other mental health and behavioral disorders: Secondary | ICD-10-CM

## 2018-03-06 DIAGNOSIS — Z01419 Encounter for gynecological examination (general) (routine) without abnormal findings: Secondary | ICD-10-CM | POA: Diagnosis not present

## 2018-03-06 DIAGNOSIS — Z1331 Encounter for screening for depression: Secondary | ICD-10-CM

## 2018-03-06 DIAGNOSIS — Z3041 Encounter for surveillance of contraceptive pills: Secondary | ICD-10-CM

## 2018-03-06 DIAGNOSIS — Q524 Other congenital malformations of vagina: Secondary | ICD-10-CM

## 2018-03-06 MED ORDER — NORETHINDRONE 0.35 MG PO TABS: 1.0000 | ORAL_TABLET | Freq: Every day | ORAL | 4 refills | Status: DC

## 2018-03-06 NOTE — Progress Notes (Signed)
Gynecology Annual Exam  PCP: Patient, No Pcp Per  Chief Complaint:  Chief Complaint  Patient presents with  . Annual Exam    Discuss birthcontrol   History of Present Illness:  [name] is a 48 y.o. [id number] who LMP was Patient's last menstrual period was 02/16/2018 (exact date)., presents today for her annual examination.  Her menses are irregular off the pill, lasting various lengths of time (5-9 days).  Dysmenorrhea none.  She has been off her birth control pill for a couple of months.   She does have vasomotor sx. She does not utilize medications.  She is sexually active. No issues with intercourse. She does not have vaginal dryness.  Last Pap: 1 year  Results were: no abnormalities /neg, HPV DNA negative Hx of STDs: none  Last mammogram: 3 years ago  Results were: normal--routine follow-up in 12 months There is no FH of breast cancer. There is no FH of ovarian cancer. The patient does not do self-breast exams.  Colonoscopy: not due DEXA: has not been screened for osteoporosis  Tobacco use: The patient denies current or previous tobacco use. Alcohol use: social drinker Exercise: not active  She has had some external vaginal itching for which she has used monistat. Sometimes the cream works and sometimes not.   The patient wears seatbelts: yes.     Past Medical History:  Diagnosis Date  . Hypertension   . Irregular heart beat   . Overweight   . Thyroid cyst October 2015   Left lobe-6 mm.    Past Surgical History:  Procedure Laterality Date  . NO PAST SURGERIES      Prior to Admission medications   Medication Sig Start Date End Date Taking? Authorizing Provider  hydrochlorothiazide (HYDRODIURIL) 25 MG tablet Take 1 tablet (25 mg total) by mouth daily. 01/23/17   Gabriel Cirri, NP   Allergies: No Known Allergies  Obstetric History: [id number]  Family History  Problem Relation Age of Onset  . Hypertension Mother   . Hypertension Father   . Stroke  Father   . Heart disease Maternal Grandfather   . Heart disease Paternal Grandmother   . Hypertension Sister   . Breast cancer Paternal Aunt 60       contact  . Cancer Neg Hx   . Diabetes Neg Hx   . COPD Neg Hx     Social History   Socioeconomic History  . Marital status: Married    Spouse name: Not on file  . Number of children: Not on file  . Years of education: Not on file  . Highest education level: Not on file  Occupational History  . Not on file  Social Needs  . Financial resource strain: Not on file  . Food insecurity:    Worry: Not on file    Inability: Not on file  . Transportation needs:    Medical: Not on file    Non-medical: Not on file  Tobacco Use  . Smoking status: Former Games developer  . Smokeless tobacco: Never Used  Substance and Sexual Activity  . Alcohol use: No  . Drug use: No  . Sexual activity: Yes    Birth control/protection: Pill  Lifestyle  . Physical activity:    Days per week: Not on file    Minutes per session: Not on file  . Stress: Not on file  Relationships  . Social connections:    Talks on phone: Not on file    Gets together: Not on  file    Attends religious service: Not on file    Active member of club or organization: Not on file    Attends meetings of clubs or organizations: Not on file    Relationship status: Not on file  . Intimate partner violence:    Fear of current or ex partner: Not on file    Emotionally abused: Not on file    Physically abused: Not on file    Forced sexual activity: Not on file  Other Topics Concern  . Not on file  Social History Narrative  . Not on file    Review of Systems  Constitutional: Negative.   HENT: Negative.   Eyes: Negative.   Respiratory: Negative.   Cardiovascular: Negative.   Gastrointestinal: Negative.   Genitourinary: Negative.   Musculoskeletal: Negative.   Skin: Negative.   Neurological: Negative.   Psychiatric/Behavioral: Negative.      Physical Exam BP (!) 146/100  (BP Location: Left Arm, Patient Position: Sitting, Cuff Size: Normal)   Ht 5\' 9"  (1.753 m)   Wt 200 lb (90.7 kg)   LMP 02/16/2018 (Exact Date)   BMI 29.53 kg/m   Physical Exam Constitutional:      General: She is not in acute distress.    Appearance: Normal appearance. She is well-developed.  Genitourinary:     Pelvic exam was performed with patient supine.     Urethra, bladder and uterus normal.     Vulval lesion and rash present.     No vulval tenderness or ulcerations noted.     No inguinal adenopathy present in the right or left side.       There are lesions (approximately 4cm right proximal sidewall cyst, non-tender, not firm to palpation, no erythema) in the vagina.     No signs of injury in the vagina.     No vaginal discharge, erythema, tenderness or bleeding.     No cervical motion tenderness, discharge, lesion or polyp.     Uterus is mobile.     Uterus is not enlarged or tender.     No uterine mass detected.    Uterus is anteverted.     No right or left adnexal mass present.     Right adnexa not tender or full.     Left adnexa not tender or full.  HENT:     Head: Normocephalic and atraumatic.  Eyes:     General: No scleral icterus.    Conjunctiva/sclera: Conjunctivae normal.  Neck:     Musculoskeletal: Normal range of motion and neck supple.     Thyroid: No thyromegaly.  Cardiovascular:     Rate and Rhythm: Normal rate. Rhythm irregular.     Heart sounds: No murmur. No friction rub. No gallop.      Comments: Regularly irregular, a short interval followed by a longer one.  Sound consistent with bigeminy.  Pulmonary:     Effort: Pulmonary effort is normal. No respiratory distress.     Breath sounds: Normal breath sounds. No wheezing or rales.  Chest:     Breasts:        Right: No inverted nipple, mass, nipple discharge, skin change or tenderness.        Left: No inverted nipple, mass, nipple discharge, skin change or tenderness.  Abdominal:     General: Bowel  sounds are normal. There is no distension.     Palpations: Abdomen is soft. There is no mass.     Tenderness: There is no abdominal  tenderness. There is no guarding or rebound.  Musculoskeletal: Normal range of motion.        General: No swelling or tenderness.  Lymphadenopathy:     Cervical: No cervical adenopathy.     Lower Body: No right inguinal adenopathy. No left inguinal adenopathy.  Neurological:     General: No focal deficit present.     Mental Status: She is alert and oriented to person, place, and time.     Cranial Nerves: No cranial nerve deficit.  Skin:    General: Skin is warm and dry.     Findings: No erythema or rash.  Psychiatric:        Mood and Affect: Mood normal.        Behavior: Behavior normal.        Judgment: Judgment normal.     Female chaperone present for pelvic and breast  portions of the physical exam  Results: AUDIT Questionnaire (screen for alcoholism): 1 PHQ-9:  Depression screen [facility] 2/9 03/06/2018  Decreased Interest 0  Down, Depressed, Hopeless 0  PHQ - 2 Score 0  Altered sleeping 2  Tired, decreased energy 2  Change in appetite 0  Feeling bad or failure about yourself  0  Trouble concentrating 0  Moving slowly or fidgety/restless 0  Suicidal thoughts 0  PHQ-9 Score 4  Difficult doing work/chores Not difficult at all     Assessment: 48 y.o. [id number] female here for routine gynecologic examination.  Plan: Problem List Items Addressed This Visit    None    Visit Diagnoses    Women's annual routine gynecological examination    -  Primary   Relevant Medications   norethindrone (MICRONOR,CAMILA,ERRIN) 0.35 MG tablet   Screening for depression       Screening for alcoholism       Encounter for surveillance of contraceptive pills       Relevant Medications   norethindrone (MICRONOR,CAMILA,ERRIN) 0.35 MG tablet   Other cardiac arrhythmia       Gartner duct, cyst       Chronic vulvitis          Screening: -- Blood pressure  screen managed by PCP -- Colonoscopy - not due -- Mammogram - due. Patient to call [place] to arrange. She understands that it is her responsibility to arrange this. -- Weight screening: overweight: continue to monitor -- Depression screening negative (PHQ-9) -- Nutrition: normal -- cholesterol screening: per PCP -- osteoporosis screening: not due -- tobacco screening: not using -- alcohol screening: AUDIT questionnaire indicates low-risk usage. -- family history of breast cancer screening: done. not at high risk. -- no evidence of domestic violence or intimate partner violence. -- STD screening: gonorrhea/chlamydia NAAT not collected per patient request. -- pap smear not collected per ASCCP guidelines -- HPV vaccination series: not eligilbe   Cardiac arrhythmia: sounded like bigeminy today. However, she states that the rhythm she has been told about before is more irregular.  There has been no treatment. She states she has been under monitoring of this. I encouraged her to make sure her PCP is aware of this rhythm, if not already.  Chronic vulvitis: this is a new issue. Recommend barrier protection at this time. If this does not relieve the issue, will need to get biopsy of the tissue for diagnosis.  May need topical steroids.   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 She does not have any symptoms from this. Will continue to  monitor at this time.   Thomasene Mohair, MD 03/06/2018 2:06 PM

## 2018-04-02 ENCOUNTER — Telehealth: Payer: Self-pay | Admitting: Unknown Physician Specialty

## 2018-04-02 ENCOUNTER — Other Ambulatory Visit: Payer: Self-pay | Admitting: Nurse Practitioner

## 2018-04-02 MED ORDER — HYDROCHLOROTHIAZIDE 25 MG PO TABS: 25.0000 mg | ORAL_TABLET | Freq: Every day | ORAL | 1 refills | Status: DC

## 2018-04-02 NOTE — Telephone Encounter (Signed)
Patient called requesting hydrochlorothiaziade refill.

## 2018-04-02 NOTE — Telephone Encounter (Signed)
Done

## 2018-04-02 NOTE — Progress Notes (Signed)
HCTZ refill sent.  For next refill will need appointment.

## 2018-08-10 ENCOUNTER — Other Ambulatory Visit: Payer: Self-pay | Admitting: Nurse Practitioner

## 2018-08-31 ENCOUNTER — Other Ambulatory Visit: Payer: Self-pay | Admitting: Nurse Practitioner

## 2018-08-31 NOTE — Telephone Encounter (Signed)
Requested medication (s) are due for refill today   YES  Requested medication (s) are on the active medication list YES  Future visit scheduled   NO  LOV  01/23/17   Requested Prescriptions  Pending Prescriptions Disp Refills   hydrochlorothiazide (HYDRODIURIL) 25 MG tablet [Pharmacy Med Name: HYDROCHLOROTHIAZIDE 25 MG TAB] 90 tablet 1    Sig: TAKE 1 TABLET BY MOUTH EVERY DAY     Cardiovascular: Diuretics - Thiazide Failed - 08/31/2018 11:31 AM      Failed - Ca in normal range and within 360 days    Calcium  Date Value Ref Range Status  01/23/2017 10.0 8.7 - 10.2 mg/dL Final         Failed - Cr in normal range and within 360 days    Creatinine, Ser  Date Value Ref Range Status  01/23/2017 0.56 (L) 0.57 - 1.00 mg/dL Final         Failed - K in normal range and within 360 days    Potassium  Date Value Ref Range Status  01/23/2017 4.5 3.5 - 5.2 mmol/L Final         Failed - Na in normal range and within 360 days    Sodium  Date Value Ref Range Status  01/23/2017 141 134 - 144 mmol/L Final         Failed - Last BP in normal range    BP Readings from Last 1 Encounters:  03/06/18 (!) 146/100         Failed - Valid encounter within last 6 months    Recent Outpatient Visits          1 year ago Wellness examination   [facility] Kathrine Haddock, NP   2 years ago Irregular heartbeat   [facility] Kathrine Haddock, NP   3 years ago Essential hypertension, benign   [place], Satira Anis, MD   3 years ago Essential hypertension, benign   [place], Satira Anis, MD

## 2018-09-02 NOTE — Telephone Encounter (Signed)
Last visit January 2019, needs visit.  If schedules will provide enough medication to visit.

## 2018-09-02 NOTE — Telephone Encounter (Signed)
Routing to provider  

## 2018-09-03 NOTE — Telephone Encounter (Signed)
Called patient. Left detailed VM (DPR reviewed) asked patient to return call to the office to schedule an OV

## 2018-09-04 NOTE — Telephone Encounter (Signed)
Called patient. Left detailed VM (DPR reviewed) asked patient to return call to the office with any question.

## 2018-09-14 ENCOUNTER — Other Ambulatory Visit: Payer: Self-pay | Admitting: Nurse Practitioner

## 2018-09-14 NOTE — Telephone Encounter (Signed)
Requested medication (s) are due for refill today: yes  Requested medication (s) are on the active medication list: yes  Last refill:  04/02/18 #90 with 1 refill  Future visit scheduled: yes, 09/25/18  Notes to clinic:  Unable to refill per protocol. Pt has appt scheduled on 09/25/18. Can patient have refill until scheduled appt?    Requested Prescriptions  Pending Prescriptions Disp Refills   hydrochlorothiazide (HYDRODIURIL) 25 MG tablet [Pharmacy Med Name: HYDROCHLOROTHIAZIDE 25 MG TAB] 90 tablet 1    Sig: TAKE 1 TABLET BY MOUTH EVERY DAY     Cardiovascular: Diuretics - Thiazide Failed - 09/14/2018  5:42 PM      Failed - Ca in normal range and within 360 days    Calcium  Date Value Ref Range Status  01/23/2017 10.0 8.7 - 10.2 mg/dL Final         Failed - Cr in normal range and within 360 days    Creatinine, Ser  Date Value Ref Range Status  01/23/2017 0.56 (L) 0.57 - 1.00 mg/dL Final         Failed - K in normal range and within 360 days    Potassium  Date Value Ref Range Status  01/23/2017 4.5 3.5 - 5.2 mmol/L Final         Failed - Na in normal range and within 360 days    Sodium  Date Value Ref Range Status  01/23/2017 141 134 - 144 mmol/L Final         Failed - Last BP in normal range    BP Readings from Last 1 Encounters:  03/06/18 (!) 146/100         Failed - Valid encounter within last 6 months    Recent Outpatient Visits          1 year ago Wellness examination   [facility] Kathrine Haddock, NP   2 years ago Irregular heartbeat   [facility] Kathrine Haddock, NP   3 years ago Essential hypertension, benign   [place], Satira Anis, MD   3 years ago Essential hypertension, benign   [place], Satira Anis, MD      Future Appointments            In 1 week Cann[place], Barbaraann Faster, NP [employer]

## 2018-09-25 ENCOUNTER — Ambulatory Visit (INDEPENDENT_AMBULATORY_CARE_PROVIDER_SITE_OTHER): Payer: 59 | Admitting: Nurse Practitioner

## 2018-09-25 ENCOUNTER — Other Ambulatory Visit: Payer: Self-pay

## 2018-09-25 ENCOUNTER — Encounter: Payer: Self-pay | Admitting: Nurse Practitioner

## 2018-09-25 VITALS — BP 117/77 | HR 65 | Temp 98.2°F | Ht 69.0 in | Wt 194.0 lb

## 2018-09-25 DIAGNOSIS — I499 Cardiac arrhythmia, unspecified: Secondary | ICD-10-CM

## 2018-09-25 DIAGNOSIS — I10 Essential (primary) hypertension: Secondary | ICD-10-CM

## 2018-09-25 DIAGNOSIS — Z1239 Encounter for other screening for malignant neoplasm of breast: Secondary | ICD-10-CM

## 2018-09-25 MED ORDER — HYDROCHLOROTHIAZIDE 25 MG PO TABS: 25.0000 mg | ORAL_TABLET | Freq: Every day | ORAL | 2 refills | Status: DC

## 2018-09-25 NOTE — Progress Notes (Signed)
BP 117/77   Pulse 65   Temp 98.2 F (36.8 C) (Oral)   Ht 5\' 9"  (1.753 m)   Wt 194 lb (88 kg)   SpO2 97%   BMI 28.65 kg/m    Subjective:    Patient ID: Courtney Walsh, female    DOB: 1970-07-25, 48 y.o.   MRN: [id number]  HPI: Courtney Walsh is a 48 y.o. female  Chief Complaint  Patient presents with  . Hypertension   HYPERTENSION Continues on HCTZ.  Has history of irregular heart beat, has not seen cardiology and does not wish to at this time.  Denies any dizziness, SOB, or CP when present. Hypertension status: controlled  Satisfied with current treatment? yes Duration of hypertension: chronic BP monitoring frequency:  daily BP range: 120-130/70-80 BP medication side effects:  no Medication compliance: good compliance Aspirin: no Recurrent headaches: no Visual changes: no Palpitations: no Dyspnea: no Chest pain: no Lower extremity edema: no Dizzy/lightheaded: no  Relevant past medical, surgical, family and social history reviewed and updated as indicated. Interim medical history since our last visit reviewed. Allergies and medications reviewed and updated.  Review of Systems  Constitutional: Negative for activity change, appetite change, diaphoresis, fatigue and fever.  Respiratory: Negative for cough, chest tightness and shortness of breath.   Cardiovascular: Negative for chest pain, palpitations and leg swelling.  Gastrointestinal: Negative for abdominal distention, abdominal pain, constipation, diarrhea, nausea and vomiting.  Neurological: Negative for dizziness, syncope, weakness, light-headedness, numbness and headaches.  Psychiatric/Behavioral: Negative.     Per HPI unless specifically indicated above     Objective:    BP 117/77   Pulse 65   Temp 98.2 F (36.8 C) (Oral)   Ht 5\' 9"  (1.753 m)   Wt 194 lb (88 kg)   SpO2 97%   BMI 28.65 kg/m   Wt Readings from Last 3 Encounters:  09/25/18 194 lb (88 kg)  03/06/18 200 lb (90.7 kg)  01/23/17  202 lb 6.4 oz (91.8 kg)    Physical Exam Vitals signs and nursing note reviewed.  Constitutional:      General: She is awake. She is not in acute distress.    Appearance: She is well-developed. She is not ill-appearing.  HENT:     Head: Normocephalic.     Right Ear: Hearing normal.     Left Ear: Hearing normal.  Eyes:     General: Lids are normal.        Right eye: No discharge.        Left eye: No discharge.     Conjunctiva/sclera: Conjunctivae normal.     Pupils: Pupils are equal, round, and reactive to light.  Neck:     Musculoskeletal: Normal range of motion and neck supple.     Vascular: No carotid bruit.  Cardiovascular:     Rate and Rhythm: Normal rate and regular rhythm.     Heart sounds: Normal heart sounds. No murmur. No gallop.   Pulmonary:     Effort: Pulmonary effort is normal. No accessory muscle usage or respiratory distress.     Breath sounds: Normal breath sounds.  Abdominal:     General: Bowel sounds are normal.     Palpations: Abdomen is soft.  Musculoskeletal:     Right lower leg: No edema.     Left lower leg: No edema.  Skin:    General: Skin is warm and dry.  Neurological:     Mental Status: She is alert and oriented to person,  place, and time.  Psychiatric:        Attention and Perception: Attention normal.        Mood and Affect: Mood normal.        Behavior: Behavior normal. Behavior is cooperative.        Thought Content: Thought content normal.        Judgment: Judgment normal.     Results for orders placed or performed in visit on 01/23/17  Comprehensive metabolic panel  Result Value Ref Range   Glucose 87 65 - 99 mg/dL   BUN 10 6 - 24 mg/dL   Creatinine, Ser 1.610.56 (L) 0.57 - 1.00 mg/dL   GFR calc non Af Amer 111 >59 mL/min/1.73   GFR calc Af Amer 128 >59 mL/min/1.73   BUN/Creatinine Ratio 18 9 - 23   Sodium 141 134 - 144 mmol/L   Potassium 4.5 3.5 - 5.2 mmol/L   Chloride 97 96 - 106 mmol/L   CO2 29 20 - 29 mmol/L   Calcium 10.0 8.7  - 10.2 mg/dL   Total Protein 7.5 6.0 - 8.5 g/dL   Albumin 4.3 3.5 - 5.5 g/dL   Globulin, Total 3.2 1.5 - 4.5 g/dL   Albumin/Globulin Ratio 1.3 1.2 - 2.2   Bilirubin Total 0.6 0.0 - 1.2 mg/dL   Alkaline Phosphatase 124 (H) 39 - 117 IU/L   AST 24 0 - 40 IU/L   ALT 28 0 - 32 IU/L  Lipid Panel w/o Chol/HDL Ratio  Result Value Ref Range   Cholesterol, Total 179 100 - 199 mg/dL   Triglycerides 096258 (H) 0 - 149 mg/dL   HDL 43 >04>39 mg/dL   VLDL Cholesterol Cal 52 (H) 5 - 40 mg/dL   LDL Calculated 84 0 - 99 mg/dL  CBC with Differential/Platelet  Result Value Ref Range   WBC 9.5 3.4 - 10.8 x10E3/uL   RBC 5.07 3.77 - 5.28 x10E6/uL   Hemoglobin 15.1 11.1 - 15.9 g/dL   Hematocrit 54.044.9 98.134.0 - 46.6 %   MCV 89 79 - 97 fL   MCH 29.8 26.6 - 33.0 pg   MCHC 33.6 31.5 - 35.7 g/dL   RDW 19.113.4 47.812.3 - 29.515.4 %   Platelets 239 150 - 379 x10E3/uL   Neutrophils 63 Not Estab. %   Lymphs 28 Not Estab. %   Monocytes 8 Not Estab. %   Eos 1 Not Estab. %   Basos 0 Not Estab. %   Neutrophils Absolute 5.9 1.4 - 7.0 x10E3/uL   Lymphocytes Absolute 2.7 0.7 - 3.1 x10E3/uL   Monocytes Absolute 0.8 0.1 - 0.9 x10E3/uL   EOS (ABSOLUTE) 0.1 0.0 - 0.4 x10E3/uL   Basophils Absolute 0.0 0.0 - 0.2 x10E3/uL   Immature Granulocytes 0 Not Estab. %   Immature Grans (Abs) 0.0 0.0 - 0.1 x10E3/uL  TSH  Result Value Ref Range   TSH 1.860 0.450 - 4.500 uIU/mL      Assessment & Plan:   Problem List Items Addressed This Visit      Cardiovascular and Mediastinum   Essential hypertension, benign    Chronic, stable with BP at goal today and on home readings.  Continue current medication regimen.  CMP today.  Return in 5 months for physical.      Relevant Medications   hydrochlorothiazide (HYDRODIURIL) 25 MG tablet   Other Relevant Orders   Comprehensive metabolic panel     Other   Irregular heartbeat    Regular on exam today.  Will continue  to monitor.  She refuses referral to cardiology.  Suspect PAC or PVC based on past  exams review.       Other Visit Diagnoses    Breast cancer screening    -  Primary   Relevant Orders   MM DIGITAL SCREENING BILATERAL       Follow up plan: Return in about 5 months (around 02/25/2019) for Annual physical.

## 2018-09-25 NOTE — Patient Instructions (Addendum)
Please call this number to schedule you mammogram: [phone]   DASH Eating Plan DASH stands for "Dietary Approaches to Stop Hypertension." The DASH eating plan is a healthy eating plan that has been shown to reduce high blood pressure (hypertension). It may also reduce your risk for type 2 diabetes, heart disease, and stroke. The DASH eating plan may also help with weight loss. What are tips for following this plan?  General guidelines  Avoid eating more than 2,300 mg (milligrams) of salt (sodium) a day. If you have hypertension, you may need to reduce your sodium intake to 1,500 mg a day.  Limit alcohol intake to no more than 1 drink a day for nonpregnant women and 2 drinks a day for men. One drink equals 12 oz of beer, 5 oz of wine, or 1 oz of hard liquor.  Work with your health care provider to maintain a healthy body weight or to lose weight. Ask what an ideal weight is for you.  Get at least 30 minutes of exercise that causes your heart to beat faster (aerobic exercise) most days of the week. Activities may include walking, swimming, or biking.  Work with your health care provider or diet and nutrition specialist (dietitian) to adjust your eating plan to your individual calorie needs. Reading food labels   Check food labels for the amount of sodium per serving. Choose foods with less than 5 percent of the Daily Value of sodium. Generally, foods with less than 300 mg of sodium per serving fit into this eating plan.  To find whole grains, look for the word "whole" as the first word in the ingredient list. Shopping  Buy products labeled as "low-sodium" or "no salt added."  Buy fresh foods. Avoid canned foods and premade or frozen meals. Cooking  Avoid adding salt when cooking. Use salt-free seasonings or herbs instead of table salt or sea salt. Check with your health care provider or pharmacist before using salt substitutes.  Do not fry foods. Cook foods using healthy methods  such as baking, boiling, grilling, and broiling instead.  Cook with heart-healthy oils, such as olive, canola, soybean, or sunflower oil. Meal planning  Eat a balanced diet that includes: ? 5 or more servings of fruits and vegetables each day. At each meal, try to fill half of your plate with fruits and vegetables. ? Up to 6-8 servings of whole grains each day. ? Less than 6 oz of lean meat, poultry, or fish each day. A 3-oz serving of meat is about the same size as a deck of cards. One egg equals 1 oz. ? 2 servings of low-fat dairy each day. ? A serving of nuts, seeds, or beans 5 times each week. ? Heart-healthy fats. Healthy fats called Omega-3 fatty acids are found in foods such as flaxseeds and coldwater fish, like sardines, salmon, and mackerel.  Limit how much you eat of the following: ? Canned or prepackaged foods. ? Food that is high in trans fat, such as fried foods. ? Food that is high in saturated fat, such as fatty meat. ? Sweets, desserts, sugary drinks, and other foods with added sugar. ? Full-fat dairy products.  Do not salt foods before eating.  Try to eat at least 2 vegetarian meals each week.  Eat more home-cooked food and less restaurant, buffet, and fast food.  When eating at a restaurant, ask that your food be prepared with less salt or no salt, if possible. What foods are recommended? The items listed  may not be a complete list. Talk with your dietitian about what dietary choices are best for you. Grains Whole-grain or whole-wheat bread. Whole-grain or whole-wheat pasta. Brown rice. [place]row. Bulgur. Whole-grain and low-sodium cereals. Pita bread. Low-fat, low-sodium crackers. Whole-wheat flour tortillas. Vegetables Fresh or frozen vegetables (raw, steamed, roasted, or grilled). Low-sodium or reduced-sodium tomato and vegetable juice. Low-sodium or reduced-sodium tomato sauce and tomato paste. Low-sodium or reduced-sodium canned vegetables. Fruits All  fresh, dried, or frozen fruit. Canned fruit in natural juice (without added sugar). Meat and other protein foods Skinless chicken or [place]. Ground chicken or [place]. Pork with fat trimmed off. Fish and seafood. Egg whites. Dried beans, peas, or lentils. Unsalted nuts, nut butters, and seeds. Unsalted canned beans. Lean cuts of beef with fat trimmed off. Low-sodium, lean deli meat. Dairy Low-fat (1%) or fat-free (skim) milk. Fat-free, low-fat, or reduced-fat cheeses. Nonfat, low-sodium ricotta or cottage cheese. Low-fat or nonfat yogurt. Low-fat, low-sodium cheese. Fats and oils Soft margarine without trans fats. Vegetable oil. Low-fat, reduced-fat, or light mayonnaise and salad dressings (reduced-sodium). Canola, safflower, olive, soybean, and sunflower oils. Avocado. Seasoning and other foods Herbs. Spices. Seasoning mixes without salt. Unsalted popcorn and pretzels. Fat-free sweets. What foods are not recommended? The items listed may not be a complete list. Talk with your dietitian about what dietary choices are best for you. Grains Baked goods made with fat, such as croissants, muffins, or some breads. Dry pasta or rice meal packs. Vegetables Creamed or fried vegetables. Vegetables in a cheese sauce. Regular canned vegetables (not low-sodium or reduced-sodium). Regular canned tomato sauce and paste (not low-sodium or reduced-sodium). Regular tomato and vegetable juice (not low-sodium or reduced-sodium). Angie Fava. Olives. Fruits Canned fruit in a light or heavy syrup. Fried fruit. Fruit in cream or butter sauce. Meat and other protein foods Fatty cuts of meat. Ribs. Fried meat. Berniece Salines. Sausage. Bologna and other processed lunch meats. Salami. Fatback. Hotdogs. Bratwurst. Salted nuts and seeds. Canned beans with added salt. Canned or smoked fish. Whole eggs or egg yolks. Chicken or [place] with skin. Dairy Whole or 2% milk, cream, and half-and-half. Whole or full-fat cream cheese. Whole-fat or  sweetened yogurt. Full-fat cheese. Nondairy creamers. Whipped toppings. Processed cheese and cheese spreads. Fats and oils Butter. Stick margarine. Lard. Shortening. Ghee. Bacon fat. Tropical oils, such as coconut, palm kernel, or palm oil. Seasoning and other foods Salted popcorn and pretzels. Onion salt, garlic salt, seasoned salt, table salt, and sea salt. Worcestershire sauce. Tartar sauce. Barbecue sauce. Teriyaki sauce. Soy sauce, including reduced-sodium. Steak sauce. Canned and packaged gravies. Fish sauce. Oyster sauce. Cocktail sauce. Horseradish that you find on the shelf. Ketchup. Mustard. Meat flavorings and tenderizers. Bouillon cubes. Hot sauce and Tabasco sauce. Premade or packaged marinades. Premade or packaged taco seasonings. Relishes. Regular salad dressings. Where to find more information:  National Heart, Lung, and [place]: https://wilson-eaton.com/  American Heart Association: www.heart.org Summary  The DASH eating plan is a healthy eating plan that has been shown to reduce high blood pressure (hypertension). It may also reduce your risk for type 2 diabetes, heart disease, and stroke.  With the DASH eating plan, you should limit salt (sodium) intake to 2,300 mg a day. If you have hypertension, you may need to reduce your sodium intake to 1,500 mg a day.  When on the DASH eating plan, aim to eat more fresh fruits and vegetables, whole grains, lean proteins, low-fat dairy, and heart-healthy fats.  Work with your health care provider or diet  and nutrition specialist (dietitian) to adjust your eating plan to your individual calorie needs. This information is not intended to replace advice given to you by your health care provider. Make sure you discuss any questions you have with your health care provider. Document Released: 12/15/2010 Document Revised: 12/08/2016 Document Reviewed: 12/20/2015 Elsevier Patient Education  2020 [employer].

## 2018-09-25 NOTE — Assessment & Plan Note (Signed)
Chronic, stable with BP at goal today and on home readings.  Continue current medication regimen.  CMP today.  Return in 5 months for physical.

## 2018-09-25 NOTE — Assessment & Plan Note (Addendum)
Regular on exam today.  Will continue to monitor.  She refuses referral to cardiology.  Suspect PAC or PVC based on past exams review.

## 2018-09-26 LAB — COMPREHENSIVE METABOLIC PANEL
ALT: 14 IU/L (ref 0–32)
AST: 14 IU/L (ref 0–40)
Albumin/Globulin Ratio: 1.7 (ref 1.2–2.2)
Albumin: 4.5 g/dL (ref 3.8–4.8)
Alkaline Phosphatase: 112 IU/L (ref 39–117)
BUN/Creatinine Ratio: 18 (ref 9–23)
BUN: 13 mg/dL (ref 6–24)
Bilirubin Total: 0.6 mg/dL (ref 0.0–1.2)
CO2: 27 mmol/L (ref 20–29)
Calcium: 9.8 mg/dL (ref 8.7–10.2)
Chloride: 100 mmol/L (ref 96–106)
Creatinine, Ser: 0.71 mg/dL (ref 0.57–1.00)
GFR calc Af Amer: 115 mL/min/{1.73_m2} (ref >59)
GFR calc non Af Amer: 100 mL/min/{1.73_m2} (ref >59)
Globulin, Total: 2.6 g/dL (ref 1.5–4.5)
Glucose: 89 mg/dL (ref 65–99)
Potassium: 3.6 mmol/L (ref 3.5–5.2)
Sodium: 142 mmol/L (ref 134–144)
Total Protein: 7.1 g/dL (ref 6.0–8.5)

## 2019-01-08 ENCOUNTER — Ambulatory Visit: Payer: 59 | Attending: Internal Medicine

## 2019-01-08 DIAGNOSIS — Z20822 Contact with and (suspected) exposure to covid-19: Secondary | ICD-10-CM

## 2019-01-09 LAB — NOVEL CORONAVIRUS, NAA: SARS-CoV-2, NAA: NOT DETECTED

## 2019-02-26 ENCOUNTER — Ambulatory Visit: Payer: 59 | Admitting: Nurse Practitioner

## 2019-04-14 ENCOUNTER — Other Ambulatory Visit: Payer: Self-pay | Admitting: Obstetrics and Gynecology

## 2019-04-14 DIAGNOSIS — Z01419 Encounter for gynecological examination (general) (routine) without abnormal findings: Secondary | ICD-10-CM

## 2019-04-14 DIAGNOSIS — Z3041 Encounter for surveillance of contraceptive pills: Secondary | ICD-10-CM

## 2019-05-19 ENCOUNTER — Other Ambulatory Visit: Payer: Self-pay

## 2019-05-19 DIAGNOSIS — Z01419 Encounter for gynecological examination (general) (routine) without abnormal findings: Secondary | ICD-10-CM

## 2019-05-19 DIAGNOSIS — Z3041 Encounter for surveillance of contraceptive pills: Secondary | ICD-10-CM

## 2019-05-19 MED ORDER — NORETHINDRONE 0.35 MG PO TABS: 1.0000 | ORAL_TABLET | Freq: Every day | ORAL | 0 refills | Status: DC

## 2019-05-19 NOTE — Telephone Encounter (Signed)
LMVM to notify refill sent. Pt advised to check w/pharmacy to be sure it is ready for p/u.

## 2019-05-19 NOTE — Telephone Encounter (Signed)
Patient requesting 1 mo rf of OCP. Has apt for AE 06/04/2019

## 2019-06-02 ENCOUNTER — Telehealth: Payer: Self-pay | Admitting: Obstetrics and Gynecology

## 2019-06-02 ENCOUNTER — Other Ambulatory Visit: Payer: Self-pay

## 2019-06-02 DIAGNOSIS — Z01419 Encounter for gynecological examination (general) (routine) without abnormal findings: Secondary | ICD-10-CM

## 2019-06-02 DIAGNOSIS — Z3041 Encounter for surveillance of contraceptive pills: Secondary | ICD-10-CM

## 2019-06-02 MED ORDER — NORETHINDRONE 0.35 MG PO TABS: 1.0000 | ORAL_TABLET | Freq: Every day | ORAL | 0 refills | Status: DC

## 2019-06-02 NOTE — Telephone Encounter (Signed)
Refilled just to get pt to annual. No more refills until pt is seen

## 2019-06-02 NOTE — Telephone Encounter (Signed)
Pt needs refill on bc

## 2019-06-04 ENCOUNTER — Ambulatory Visit: Payer: 59 | Admitting: Obstetrics and Gynecology

## 2019-06-09 ENCOUNTER — Other Ambulatory Visit: Payer: Self-pay | Admitting: Obstetrics and Gynecology

## 2019-06-09 DIAGNOSIS — Z3041 Encounter for surveillance of contraceptive pills: Secondary | ICD-10-CM

## 2019-06-09 DIAGNOSIS — Z01419 Encounter for gynecological examination (general) (routine) without abnormal findings: Secondary | ICD-10-CM

## 2019-07-06 ENCOUNTER — Other Ambulatory Visit: Payer: Self-pay | Admitting: Obstetrics and Gynecology

## 2019-07-06 DIAGNOSIS — Z01419 Encounter for gynecological examination (general) (routine) without abnormal findings: Secondary | ICD-10-CM

## 2019-07-06 DIAGNOSIS — Z3041 Encounter for surveillance of contraceptive pills: Secondary | ICD-10-CM

## 2019-07-07 NOTE — Telephone Encounter (Signed)
Please advise 

## 2019-07-09 ENCOUNTER — Ambulatory Visit: Payer: No Typology Code available for payment source | Admitting: Obstetrics and Gynecology

## 2019-07-09 ENCOUNTER — Other Ambulatory Visit: Payer: Self-pay

## 2019-07-09 ENCOUNTER — Telehealth: Payer: Self-pay | Admitting: Obstetrics and Gynecology

## 2019-07-09 DIAGNOSIS — Z01419 Encounter for gynecological examination (general) (routine) without abnormal findings: Secondary | ICD-10-CM

## 2019-07-09 DIAGNOSIS — Z3041 Encounter for surveillance of contraceptive pills: Secondary | ICD-10-CM

## 2019-07-09 MED ORDER — NORETHINDRONE 0.35 MG PO TABS: 1.0000 | ORAL_TABLET | Freq: Every day | ORAL | 0 refills | Status: DC

## 2019-07-09 NOTE — Telephone Encounter (Signed)
Refill was sent in. More will be sent in at her Annual when she comes

## 2019-07-09 NOTE — Telephone Encounter (Signed)
Patient was scheduled for today for annual and has started her menstrual cycle. Patient is rescheduled to 08/25/19 for annual. Requesting for birthcontrol refill to be sent in to get to her appointment please.

## 2019-07-30 ENCOUNTER — Other Ambulatory Visit: Payer: Self-pay | Admitting: Nurse Practitioner

## 2019-07-30 NOTE — Telephone Encounter (Signed)
Pt made aware of message from provider,Pt stated she would call back due to being out of town and not having Schedule with her. Pt understood and verbalized understanding.

## 2019-07-30 NOTE — Telephone Encounter (Signed)
Routing to provider  

## 2019-07-30 NOTE — Telephone Encounter (Signed)
Requested medications are due for refill today?  Yes  Requested medications are on active medication list?  Yes  Last Refill:  09/25/2018  # 90 with 2 refills   Future visit scheduled? No   Notes to Clinic:  Medication failed RX refill protocol due to no valid encounter in the past 6 months.  Last visit was 10 months ago.

## 2019-08-25 ENCOUNTER — Ambulatory Visit: Payer: No Typology Code available for payment source | Admitting: Obstetrics and Gynecology

## 2019-08-29 ENCOUNTER — Ambulatory Visit: Payer: No Typology Code available for payment source | Admitting: Obstetrics and Gynecology

## 2019-09-01 ENCOUNTER — Other Ambulatory Visit: Payer: Self-pay

## 2019-09-01 ENCOUNTER — Encounter: Payer: Self-pay | Admitting: Obstetrics and Gynecology

## 2019-09-01 ENCOUNTER — Ambulatory Visit (INDEPENDENT_AMBULATORY_CARE_PROVIDER_SITE_OTHER): Payer: No Typology Code available for payment source | Admitting: Obstetrics and Gynecology

## 2019-09-01 VITALS — BP 130/74 | Ht 69.0 in | Wt 193.2 lb

## 2019-09-01 DIAGNOSIS — Z1339 Encounter for screening examination for other mental health and behavioral disorders: Secondary | ICD-10-CM

## 2019-09-01 DIAGNOSIS — N763 Subacute and chronic vulvitis: Secondary | ICD-10-CM | POA: Diagnosis not present

## 2019-09-01 DIAGNOSIS — Z01419 Encounter for gynecological examination (general) (routine) without abnormal findings: Secondary | ICD-10-CM | POA: Diagnosis not present

## 2019-09-01 DIAGNOSIS — Z3041 Encounter for surveillance of contraceptive pills: Secondary | ICD-10-CM

## 2019-09-01 DIAGNOSIS — Z1331 Encounter for screening for depression: Secondary | ICD-10-CM | POA: Diagnosis not present

## 2019-09-01 DIAGNOSIS — Q524 Other congenital malformations of vagina: Secondary | ICD-10-CM

## 2019-09-01 MED ORDER — TRIAMCINOLONE ACETONIDE 0.1 % EX OINT: 1.0000 "application " | TOPICAL_OINTMENT | Freq: Two times a day (BID) | CUTANEOUS | 1 refills | Status: DC

## 2019-09-01 MED ORDER — NORETHINDRONE 0.35 MG PO TABS: 1.0000 | ORAL_TABLET | Freq: Every day | ORAL | 3 refills | Status: DC

## 2019-09-01 NOTE — Patient Instructions (Signed)
[facility] Mammography service in [place], [place] Get online care: [email or web address] Located in: [facility] Address: First Floor - [facility], [address] [place], [place], [place] [address] Phone: [phone]

## 2019-09-01 NOTE — Progress Notes (Signed)
Routine Annual Gynecology Examination   PCP: Gabriel Cirri, NP  Chief Complaint  Patient presents with  . Gynecologic Exam    annual exam   History of Present Illness: Patient is a 49 y.o. [id number] presents for annual exam. The patient has no complaints today.   Menses are irregular, last variable amounts of time. She continues to take norethindrone for contraception.    Menopausal symptoms: reports hot flashes, she gets up several times during the night to go to the bathroom.  The issue of going to the restroom isn't present during the day.   Breast symptoms: denies  Last pap smear: 01/23/2017 years ago.  Result Normal, HPV negative  Last mammogram: 4 years ago.  Result Normal  She states that she has had no changes in her cardiac status.  She has not seen a cardiologist.  She continues to have intermittent vulvar itching. She will use the external creams, like Vagisil or wipes.  These help a little, then the itching starts up again.   Past Medical History:  Diagnosis Date  . Hypertension   . Irregular heart beat   . Overweight   . Thyroid cyst October 2015   Left lobe-6 mm.    Past Surgical History:  Procedure Laterality Date  . NO PAST SURGERIES      Prior to Admission medications   Medication Sig Start Date End Date Taking? Authorizing Provider  hydrochlorothiazide (HYDRODIURIL) 25 MG tablet TAKE 1 TABLET BY MOUTH EVERY DAY 07/30/19  Yes Cannady, Jolene T, NP  norethindrone (MICRONOR) 0.35 MG tablet Take 1 tablet (0.35 mg total) by mouth daily. 07/09/19  Yes Conard Novak, MD   Allergies: No Known Allergies  Obstetric History: [id number]  Social History   Socioeconomic History  . Marital status: Married    Spouse name: Not on file  . Number of children: Not on file  . Years of education: Not on file  . Highest education level: Not on file  Occupational History  . Not on file  Tobacco Use  . Smoking status: Former Games developer  . Smokeless tobacco: Never  Used  Vaping Use  . Vaping Use: Never used  Substance and Sexual Activity  . Alcohol use: No  . Drug use: No  . Sexual activity: Yes    Birth control/protection: Pill  Other Topics Concern  . Not on file  Social History Narrative  . Not on file   Social Determinants of Health   Financial Resource Strain:   . Difficulty of Paying Living Expenses: Not on file  Food Insecurity:   . Worried About Programme researcher, broadcasting/film/video in the Last Year: Not on file  . Ran Out of Food in the Last Year: Not on file  Transportation Needs:   . Lack of Transportation (Medical): Not on file  . Lack of Transportation (Non-Medical): Not on file  Physical Activity:   . Days of Exercise per Week: Not on file  . Minutes of Exercise per Session: Not on file  Stress:   . Feeling of Stress : Not on file  Social Connections:   . Frequency of Communication with Friends and Family: Not on file  . Frequency of Social Gatherings with Friends and Family: Not on file  . Attends Religious Services: Not on file  . Active Member of Clubs or Organizations: Not on file  . Attends Banker Meetings: Not on file  . Marital Status: Not on file  Intimate Partner Violence:   .  Fear of Current or Ex-Partner: Not on file  . Emotionally Abused: Not on file  . Physically Abused: Not on file  . Sexually Abused: Not on file    Family History  Problem Relation Age of Onset  . Hypertension Mother   . Hypertension Father   . Stroke Father   . Heart disease Maternal Grandfather   . Heart disease Paternal Grandmother   . Hypertension Sister   . Breast cancer Paternal Aunt 60       contact  . Cancer Neg Hx   . Diabetes Neg Hx   . COPD Neg Hx    Review of Systems  Constitutional: Negative.   HENT: Negative.   Eyes: Negative.   Respiratory: Negative.   Cardiovascular: Negative.   Gastrointestinal: Negative.   Genitourinary: Negative.   Musculoskeletal: Negative.   Skin: Negative.   Neurological: Negative.     Psychiatric/Behavioral: Negative.      Physical Exam Vitals: BP 130/74   Ht 5\' 9"  (1.753 m)   Wt 193 lb 3.2 oz (87.6 kg)   LMP 08/29/2019   BMI 28.53 kg/m   Physical Exam Constitutional:      General: She is not in acute distress.    Appearance: Normal appearance. She is well-developed.  Genitourinary:     Pelvic exam was performed with patient in the lithotomy position.     Vulva, urethra, bladder and uterus normal.     No inguinal adenopathy present in the right or left side.       No signs of injury in the vagina.     No vaginal discharge, erythema, tenderness or bleeding.     Vaginal exam comments: Right Gartner's duct cyst, ~5 cm (essentially unchanged). Protrudes to hymenal ring on inspection..     Cervical nabothian cyst present.     No cervical motion tenderness, discharge, lesion or polyp.     Uterus is mobile.     Uterus is not enlarged or tender.     No uterine mass detected.    Uterus is anteverted.     No right or left adnexal mass present.     Right adnexa not tender or full.     Left adnexa not tender or full.  HENT:     Head: Normocephalic and atraumatic.  Eyes:     General: No scleral icterus.    Conjunctiva/sclera: Conjunctivae normal.  Neck:     Thyroid: No thyromegaly.  Cardiovascular:     Rate and Rhythm: Normal rate. Rhythm irregular.     Heart sounds: No murmur heard.  No friction rub. No gallop.      Comments: Regularly irregular.  Two beats, followed by a delay.   Pulmonary:     Effort: Pulmonary effort is normal. No respiratory distress.     Breath sounds: Normal breath sounds. No wheezing or rales.  Chest:     Breasts:        Right: No inverted nipple, mass, nipple discharge, skin change or tenderness.        Left: No inverted nipple, mass, nipple discharge, skin change or tenderness.  Abdominal:     General: Bowel sounds are normal. There is no distension.     Palpations: Abdomen is soft. There is no mass.     Tenderness: There is  no abdominal tenderness. There is no guarding or rebound.  Musculoskeletal:        General: No swelling or tenderness. Normal range of motion.     Cervical  back: Normal range of motion and neck supple.  Lymphadenopathy:     Cervical: No cervical adenopathy.     Lower Body: No right inguinal adenopathy. No left inguinal adenopathy.  Neurological:     General: No focal deficit present.     Mental Status: She is alert and oriented to person, place, and time.     Cranial Nerves: No cranial nerve deficit.  Skin:    General: Skin is warm and dry.     Findings: No erythema or rash.  Psychiatric:        Mood and Affect: Mood normal.        Behavior: Behavior normal.        Judgment: Judgment normal.      Female chaperone present for pelvic and breast  portions of the physical exam  Results: AUDIT Questionnaire (screen for alcoholism): 3 PHQ-9: 1   Assessment and Plan:  49 y.o. [id number] female here for routine annual gynecologic examination  Plan: Problem List Items Addressed This Visit    None    Visit Diagnoses    Women's annual routine gynecological examination    -  Primary   Relevant Medications   triamcinolone ointment (KENALOG) 0.1 %   norethindrone (MICRONOR) 0.35 MG tablet   Screening for depression       Screening for alcoholism       Chronic vulvitis       Relevant Medications   triamcinolone ointment (KENALOG) 0.1 %   Gartner duct, cyst       Encounter for surveillance of contraceptive pills       Relevant Medications   norethindrone (MICRONOR) 0.35 MG tablet      Screening: -- Blood pressure screen managed by PCP -- Colonoscopy - due - managed by PCP -- Mammogram - due. Patient to call [facility] to arrange. She understands that it is her responsibility to arrange this. -- Weight screening: normal -- Depression screening negative (PHQ-9) -- Nutrition: normal -- cholesterol screening: per PCP -- osteoporosis screening: not due -- tobacco screening: not  using -- alcohol screening: AUDIT questionnaire indicates low-risk usage. -- family history of breast cancer screening: done. not at high risk. -- no evidence of domestic violence or intimate partner violence. -- STD screening: gonorrhea/chlamydia NAAT not collected per patient request. 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 Patient is asymptomatic.  Discussed seeing a cardiologist to treat now so that condition does not become worse.  She will consider.    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 Discussed 5% association with squamous cell cancer.  Will treat for now with topical steroid.  Discussed seeing dermatologist.   Thomasene Mohair, MD 09/01/2019 9:26 AM

## 2019-10-25 ENCOUNTER — Other Ambulatory Visit: Payer: Self-pay | Admitting: Nurse Practitioner

## 2019-10-25 NOTE — Telephone Encounter (Signed)
Requested medication (s) are due for refill today: yes  Requested medication (s) are on the active medication list: yes  Last refill:  07/30/19  Future visit scheduled: no  Notes to clinic:  Called pt and LM on VM to call office and schedule an appt.   Requested Prescriptions  Pending Prescriptions Disp Refills   hydrochlorothiazide (HYDRODIURIL) 25 MG tablet [Pharmacy Med Name: HYDROCHLOROTHIAZIDE 25 MG TAB] 90 tablet 0    Sig: TAKE 1 TABLET BY MOUTH EVERY DAY      Cardiovascular: Diuretics - Thiazide Failed - 10/25/2019  8:45 AM      Failed - Ca in normal range and within 360 days    Calcium  Date Value Ref Range Status  09/25/2018 9.8 8.7 - 10.2 mg/dL Final          Failed - Cr in normal range and within 360 days    Creatinine, Ser  Date Value Ref Range Status  09/25/2018 0.71 0.57 - 1.00 mg/dL Final          Failed - K in normal range and within 360 days    Potassium  Date Value Ref Range Status  09/25/2018 3.6 3.5 - 5.2 mmol/L Final          Failed - Na in normal range and within 360 days    Sodium  Date Value Ref Range Status  09/25/2018 142 134 - 144 mmol/L Final          Failed - Valid encounter within last 6 months    Recent Outpatient Visits           1 year ago Breast cancer screening   [facility] [place], Corrie Dandy T, NP   2 years ago Wellness examination   [facility] Gabriel Cirri, NP   4 years ago Irregular heartbeat   [facility] Gabriel Cirri, NP   4 years ago Essential hypertension, benign   [facility] [place], Janit Bern, MD   4 years ago Essential hypertension, benign   [facility] [place], Janit Bern, MD              Passed - Last BP in normal range    BP Readings from Last 1 Encounters:  09/01/19 130/74

## 2019-10-27 NOTE — Telephone Encounter (Signed)
Called to schedule f/u no answer, left vm 

## 2019-10-29 NOTE — Telephone Encounter (Signed)
Called to schedule f/u no answer, left vm 

## 2019-10-30 NOTE — Telephone Encounter (Signed)
Called to schedule f/u no answer, left vm 

## 2020-01-20 ENCOUNTER — Other Ambulatory Visit: Payer: Self-pay | Admitting: Nurse Practitioner

## 2020-02-15 ENCOUNTER — Other Ambulatory Visit: Payer: Self-pay | Admitting: Nurse Practitioner

## 2020-03-05 ENCOUNTER — Ambulatory Visit (INDEPENDENT_AMBULATORY_CARE_PROVIDER_SITE_OTHER): Payer: BC Managed Care – PPO | Admitting: Nurse Practitioner

## 2020-03-05 ENCOUNTER — Other Ambulatory Visit: Payer: Self-pay

## 2020-03-05 ENCOUNTER — Encounter: Payer: Self-pay | Admitting: Nurse Practitioner

## 2020-03-05 VITALS — BP 135/79 | HR 73 | Temp 98.4°F | Wt 199.4 lb

## 2020-03-05 DIAGNOSIS — I10 Essential (primary) hypertension: Secondary | ICD-10-CM

## 2020-03-05 DIAGNOSIS — L409 Psoriasis, unspecified: Secondary | ICD-10-CM | POA: Diagnosis not present

## 2020-03-05 MED ORDER — HYDROCHLOROTHIAZIDE 25 MG PO TABS: 25.0000 mg | ORAL_TABLET | Freq: Every day | ORAL | 4 refills | Status: DC

## 2020-03-05 NOTE — Assessment & Plan Note (Signed)
Chronic, stable with BP at goal today.  Recommend she monitor BP at least a few mornings a week at home and document.  DASH diet at home.  Continue current medication regimen and adjust as needed, refills sent in.  Labs today: CMP and TSH.  Return in 6 months.

## 2020-03-05 NOTE — Patient Instructions (Signed)
Psoriasis Psoriasis is a long-term (chronic) skin condition. It occurs because your body's defense system (immune system) causes skin cells to form too quickly. This causes raised, red patches (plaques) on your skin that look silvery. The patches may be on all areas of your body. They can be any size or shape. Psoriasis can come and go. It can range from mild to very bad. It cannot be passed from one person to another (is not contagious). There is no cure for this condition, but it can be helped with treatment. What are the causes? The cause of psoriasis is not known. Some things can make it worse. These are:  Skin damage, such as cuts, scrapes, sunburn, and dryness.  Not getting enough sunlight.  Some medicines.  Alcohol.  Tobacco.  Stress.  Infections. What increases the risk?  Having a family member with psoriasis.  Being very overweight (obese).  Being 20-40 years old.  Taking certain medicines. What are the signs or symptoms? There are different types of psoriasis. The types are:  Plaque. This is the most common. Symptoms include red, raised patches with a silvery coating. These may be itchy. Your nails may be crumbly or fall off.  Guttate. Symptoms include small red spots on your stomach area, arms, and legs. These may happen after you have been sick, such as with strep throat.  Inverse. Symptoms include patches in your armpits, under your breasts, private areas, or on your butt.  Pustular. Symptoms include pus-filled bumps on the palms of your hands or the soles of your feet. You also may feel very tired, weak, have a fever, and not be hungry.  Erythrodermic. Symptoms include bright red skin that looks burned. You may have a fast heartbeat and a body temperature that is too high or too low. You may be itchy or in pain.  Sebopsoriasis. Symptoms include red patches on your scalp, forehead, and face that are greasy.  Psoriatic arthritis. Symptoms include swollen,  painful joints along with scaly skin patches.   How is this treated? There is no cure for this condition, but treatment can:  Help your skin heal.  Lessen itching and irritation and swelling (inflammation).  Slow the growth of new skin cells.  Help your body's defense system respond better to your skin. Treatment may include:  Creams or ointments.  Light therapy. This may include natural sunlight or light therapy in a doctor's office.  Medicines. These can help your body better manage skin cells. They may be used with light therapy or ointments. Medicines may include pills or injections. You may also get antibiotic medicines if you have an infection. Follow these instructions at home: Skin Care  Apply lotion to your skin as needed. Only use those that your doctor has said are okay.  Apply cool, wet cloths (cold compresses) to the affected areas.  Do not use a hot tub or take hot showers. Use slightly warm, not hot, water when taking showers and baths.  Do not scratch your skin. Lifestyle  Do not use any products that contain nicotine or tobacco, such as cigarettes, e-cigarettes, and chewing tobacco. If you need help quitting, ask your doctor.  Lower your stress.  Keep a healthy weight.  Go out in the sun as told by your doctor. Do not get sunburned.  Join a support group.   Medicines  Take or use over-the-counter and prescription medicines only as told by your doctor.  If you were prescribed an antibiotic medicine, take it as told   by your doctor. Do not stop using the antibiotic even if you start to feel better. Alcohol use If you drink alcohol:  Limit how much you use: ? 0-1 drink a day for women. ? 0-2 drinks a day for men.  Be aware of how much alcohol is in your drink. In the [place] one drink equals one 12 oz bottle of beer (355 mL), one 5 oz glass of wine (148 mL), or one 1 oz glass of hard liquor (44 mL). General instructions  Keep a journal to track the  things that cause symptoms (triggers). Try to avoid these things.  See a counselor if you feel the support would help.  Keep all follow-up visits as told by your doctor. This is important. Contact a doctor if:  You have a fever.  Your pain gets worse.  You have more redness or warmth in the affected areas.  You have new or worse pain or stiffness in your joints.  Your nails start to break easily or pull away from the nail bed.  You feel very sad (depressed). Summary  Psoriasis is a long-term (chronic) skin condition.  There is no cure for this condition, but treatment can help manage it.  Keep a journal to track the things that cause symptoms.  Take or use over-the-counter and prescription medicines only as told by your doctor.  Keep all follow-up visits as told by your doctor. This is important. This information is not intended to replace advice given to you by your health care provider. Make sure you discuss any questions you have with your health care provider. Document Revised: 10/30/2017 Document Reviewed: 10/30/2017 Elsevier Patient Education  2021 [employer] Inc.  

## 2020-03-05 NOTE — Progress Notes (Signed)
BP 135/79   Pulse 73   Temp 98.4 F (36.9 C) (Oral)   Wt 199 lb 6.4 oz (90.4 kg)   SpO2 98%   BMI 29.45 kg/m    Subjective:    Patient ID: Courtney Walsh, female    DOB: 06-30-1970, 50 y.o.   MRN: [id number]  HPI: Courtney Walsh is a 50 y.o. female  Chief Complaint  Patient presents with  . Psoriasis    Pt states she was out of town for work and states she isn't quite sure if it was psoriasis or a ringworm and was given 2 prescriptions. Pt states it has got a little better since being on antibiotic Wednesday night. Pt states itching has gotten better.  . Hypertension   HYPERTENSION Continues on daily HCTZ.   Hypertension status: stable Satisfied with current treatment? yes Duration of hypertension: chronic BP monitoring frequency:  not checking BP range:  BP medication side effects:  no Medication compliance: good compliance Aspirin: no Recurrent headaches: no Visual changes: no Palpitations: no Dyspnea: no Chest pain: no Lower extremity edema: no Dizzy/lightheaded: no   RASH Started about 2 years ago, quarter size, and then a couple months ago started growing and getting itchy.  Has virtual visit via [facility] -- she was placed on steroid cream and abx treatment, which is improving the area.  Two locations, to bottom of both legs.  Her mother and sister have head to toe plaque psoriasis, daughter also has psoriatic disorder.  Her mother has tried multiple treatments, including injectables. Duration:  chronic  Location: legs -- started on right lower leg and left lower leg showed up a couple months ago Itching: yes Burning: no Redness: yes Oozing: no Scaling: yes Blisters: no Painful: no Fevers: no Change in detergents/soaps/personal care products: no Recent illness: no Recent travel:no History of same: yes Context: fluctuating Alleviating factors: steroid cream and abx helping Treatments attempted:steroid cream and abx treatment Shortness of breath: no   Throat/tongue swelling: no Myalgias/arthralgias: no  Relevant past medical, surgical, family and social history reviewed and updated as indicated. Interim medical history since our last visit reviewed. Allergies and medications reviewed and updated.  Review of Systems  Constitutional: Negative for activity change, appetite change, diaphoresis, fatigue and fever.  Respiratory: Negative for cough, chest tightness and shortness of breath.   Cardiovascular: Negative for chest pain, palpitations and leg swelling.  Gastrointestinal: Negative.   Skin: Positive for rash.  Neurological: Negative.   Psychiatric/Behavioral: Negative.     Per HPI unless specifically indicated above     Objective:    BP 135/79   Pulse 73   Temp 98.4 F (36.9 C) (Oral)   Wt 199 lb 6.4 oz (90.4 kg)   SpO2 98%   BMI 29.45 kg/m   Wt Readings from Last 3 Encounters:  03/05/20 199 lb 6.4 oz (90.4 kg)  09/01/19 193 lb 3.2 oz (87.6 kg)  09/25/18 194 lb (88 kg)    Physical Exam Vitals and nursing note reviewed.  Constitutional:      General: She is awake. She is not in acute distress.    Appearance: She is well-developed. She is not ill-appearing.  HENT:     Head: Normocephalic.     Right Ear: Hearing normal.     Left Ear: Hearing normal.  Eyes:     General: Lids are normal.        Right eye: No discharge.        Left eye: No discharge.  Conjunctiva/sclera: Conjunctivae normal.     Pupils: Pupils are equal, round, and reactive to light.  Neck:     Vascular: No carotid bruit.  Cardiovascular:     Rate and Rhythm: Normal rate and regular rhythm.     Heart sounds: Normal heart sounds. No murmur heard. No gallop.   Pulmonary:     Effort: Pulmonary effort is normal. No accessory muscle usage or respiratory distress.     Breath sounds: Normal breath sounds.  Abdominal:     General: Bowel sounds are normal.     Palpations: Abdomen is soft.  Musculoskeletal:     Cervical back: Normal range of  motion and neck supple.     Right lower leg: No edema.     Left lower leg: No edema.  Skin:    General: Skin is warm and dry.     Findings: Rash present. Rash is scaling.     Comments: To lateral right lower leg approx 5 cm round, inflamed, psoriatic in appearance rash with scaling noted to interior.  Skin intact with no drainage or vesicles.  Similar in appearance, although smaller (approx 3 cm), area to anterior left lower leg.  Neurological:     Mental Status: She is alert and oriented to person, place, and time.  Psychiatric:        Attention and Perception: Attention normal.        Mood and Affect: Mood normal.        Behavior: Behavior normal. Behavior is cooperative.        Thought Content: Thought content normal.        Judgment: Judgment normal.     Results for orders placed or performed in visit on 01/08/19  Novel Coronavirus, NAA ([facility])   Specimen: Nasopharyngeal(NP) swabs in vial transport medium   NASOPHARYNGE  TESTING  Result Value Ref Range   SARS-CoV-2, NAA Not Detected Not Detected      Assessment & Plan:   Problem List Items Addressed This Visit      Cardiovascular and Mediastinum   Essential hypertension, benign - Primary    Chronic, stable with BP at goal today.  Recommend she monitor BP at least a few mornings a week at home and document.  DASH diet at home.  Continue current medication regimen and adjust as needed, refills sent in.  Labs today: CMP and TSH.  Return in 6 months.       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 Significant family history of plaque psoriasis.  Recommend continue steroid cream and will place referral to dermatology to discuss alternate options, such as injectables.        Relevant  Orders   Ambulatory referral to Dermatology       Follow up plan: Return in about 6 months (around 09/02/2020) for HTN and Psoriasis.

## 2020-03-05 NOTE — Assessment & Plan Note (Signed)
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 Significant family history of plaque psoriasis.  Recommend continue steroid cream and will place referral to dermatology to discuss alternate options, such as injectables.

## 2020-03-06 LAB — TSH: TSH: 2.09 u[IU]/mL (ref 0.450–4.500)

## 2020-03-06 LAB — COMPREHENSIVE METABOLIC PANEL
ALT: 26 IU/L (ref 0–32)
AST: 24 IU/L (ref 0–40)
Albumin/Globulin Ratio: 1.3 (ref 1.2–2.2)
Albumin: 4.2 g/dL (ref 3.8–4.9)
Alkaline Phosphatase: 128 IU/L — ABNORMAL HIGH (ref 44–121)
BUN/Creatinine Ratio: 22 (ref 9–23)
BUN: 14 mg/dL (ref 6–24)
Bilirubin Total: 0.7 mg/dL (ref 0.0–1.2)
CO2: 25 mmol/L (ref 20–29)
Calcium: 9.6 mg/dL (ref 8.7–10.2)
Chloride: 97 mmol/L (ref 96–106)
Creatinine, Ser: 0.64 mg/dL (ref 0.57–1.00)
GFR calc Af Amer: 119 mL/min/{1.73_m2} (ref >59)
GFR calc non Af Amer: 104 mL/min/{1.73_m2} (ref >59)
Globulin, Total: 3.2 g/dL (ref 1.5–4.5)
Glucose: 74 mg/dL (ref 65–99)
Potassium: 3.5 mmol/L (ref 3.5–5.2)
Sodium: 138 mmol/L (ref 134–144)
Total Protein: 7.4 g/dL (ref 6.0–8.5)

## 2020-03-07 NOTE — Progress Notes (Signed)
Contacted via MyChart   Good evening Courtney Walsh, your labs have returned and they continue to be in normal ranges.  Continue all medications and will see you at next visit.  Have a great evening!! Keep being awesome!!  Thank you for allowing me to participate in your care. Kindest regards, Parul Porcelli

## 2020-05-18 ENCOUNTER — Telehealth: Payer: BC Managed Care – PPO | Admitting: Nurse Practitioner

## 2020-06-03 ENCOUNTER — Other Ambulatory Visit: Payer: Self-pay

## 2020-06-03 DIAGNOSIS — Z1231 Encounter for screening mammogram for malignant neoplasm of breast: Secondary | ICD-10-CM

## 2020-09-06 ENCOUNTER — Ambulatory Visit: Payer: BC Managed Care – PPO | Admitting: Nurse Practitioner

## 2020-11-03 ENCOUNTER — Other Ambulatory Visit: Payer: Self-pay | Admitting: Obstetrics and Gynecology

## 2020-11-03 DIAGNOSIS — Z3041 Encounter for surveillance of contraceptive pills: Secondary | ICD-10-CM

## 2020-11-03 DIAGNOSIS — Z01419 Encounter for gynecological examination (general) (routine) without abnormal findings: Secondary | ICD-10-CM

## 2020-11-03 NOTE — Telephone Encounter (Signed)
Needs f/u appt prior to more refills

## 2021-02-07 ENCOUNTER — Ambulatory Visit: Payer: Self-pay | Admitting: *Deleted

## 2021-02-07 NOTE — Telephone Encounter (Signed)
Reason for Disposition  [1] MODERATE dizziness (e.g., interferes with normal activities) AND [2] has NOT been evaluated by physician for this  (Exception: dizziness caused by heat exposure, sudden standing, or poor fluid intake)  Answer Assessment - Initial Assessment Questions 1. DESCRIPTION: "Describe your dizziness."     Flu like symptoms- negative COVID test 2. LIGHTHEADED: "Do you feel lightheaded?" (e.g., somewhat faint, woozy, weak upon standing)     Somewhat faint 3. VERTIGO: "Do you feel like either you or the room is spinning or tilting?" (i.e. vertigo)     *No Answer* 4. SEVERITY: "How bad is it?"  "Do you feel like you are going to faint?" "Can you stand and walk?"   - MILD: Feels slightly dizzy, but walking normally.   - MODERATE: Feels unsteady when walking, but not falling; interferes with normal activities (e.g., school, work).   - SEVERE: Unable to walk without falling, or requires assistance to walk without falling; feels like passing out now.      mild 5. ONSET:  "When did the dizziness begin?"     Started 2 days 6. AGGRAVATING FACTORS: "Does anything make it worse?" (e.g., standing, change in head position)     standing 7. HEART RATE: "Can you tell me your heart rate?" "How many beats in 15 seconds?"  (Note: not all patients can do this)       *No Answer* 8. CAUSE: "What do you think is causing the dizziness?"     flu 9. RECURRENT SYMPTOM: "Have you had dizziness before?" If Yes, ask: "When was the last time?" "What happened that time?"     *No Answer* 10. OTHER SYMPTOMS: "Do you have any other symptoms?" (e.g., fever, chest pain, vomiting, diarrhea, bleeding)       Cough, congestion 11. PREGNANCY: "Is there any chance you are pregnant?" "When was your last menstrual period?"       *No Answer*  Protocols used: Dizziness - Lightheadedness-A-AH

## 2021-02-07 NOTE — Telephone Encounter (Signed)
°  Chief Complaint: dizziness- 2-3 days, cough Symptoms: cough Frequency: started 2-3 days Pertinent Negatives: Patient denies fever, SOB Disposition: [] ED /[] Urgent Care (no appt availability in office) / [x] Appointment(In office/virtual)/ []  [facility]/ [] Home Care/ [] Refused Recommended Disposition /[] [facility]/ []  Follow-up with PCP Additional Notes:

## 2021-02-08 ENCOUNTER — Ambulatory Visit (INDEPENDENT_AMBULATORY_CARE_PROVIDER_SITE_OTHER): Payer: BC Managed Care – PPO | Admitting: Internal Medicine

## 2021-02-08 ENCOUNTER — Emergency Department: Payer: BC Managed Care – PPO

## 2021-02-08 ENCOUNTER — Inpatient Hospital Stay
Admission: EM | Admit: 2021-02-08 | Discharge: 2021-02-10 | DRG: 308 | Disposition: A | Discharge status: Home / Self Care | Payer: BC Managed Care – PPO | Source: Ambulatory Visit | Attending: Internal Medicine | Admitting: Internal Medicine

## 2021-02-08 ENCOUNTER — Encounter: Payer: Self-pay | Admitting: Internal Medicine

## 2021-02-08 ENCOUNTER — Other Ambulatory Visit: Payer: Self-pay

## 2021-02-08 VITALS — BP 130/70 | HR 55 | Temp 98.2°F | Ht 68.9 in | Wt 201.4 lb

## 2021-02-08 DIAGNOSIS — Z28310 Unvaccinated for covid-19: Secondary | ICD-10-CM

## 2021-02-08 DIAGNOSIS — L409 Psoriasis, unspecified: Secondary | ICD-10-CM | POA: Diagnosis present

## 2021-02-08 DIAGNOSIS — I110 Hypertensive heart disease with heart failure: Secondary | ICD-10-CM | POA: Diagnosis present

## 2021-02-08 DIAGNOSIS — Z803 Family history of malignant neoplasm of breast: Secondary | ICD-10-CM

## 2021-02-08 DIAGNOSIS — E860 Dehydration: Secondary | ICD-10-CM | POA: Diagnosis not present

## 2021-02-08 DIAGNOSIS — J40 Bronchitis, not specified as acute or chronic: Secondary | ICD-10-CM | POA: Diagnosis not present

## 2021-02-08 DIAGNOSIS — I10 Essential (primary) hypertension: Secondary | ICD-10-CM | POA: Diagnosis present

## 2021-02-08 DIAGNOSIS — R009 Unspecified abnormalities of heart beat: Secondary | ICD-10-CM

## 2021-02-08 DIAGNOSIS — Z87891 Personal history of nicotine dependence: Secondary | ICD-10-CM

## 2021-02-08 DIAGNOSIS — I429 Cardiomyopathy, unspecified: Secondary | ICD-10-CM | POA: Diagnosis present

## 2021-02-08 DIAGNOSIS — I5021 Acute systolic (congestive) heart failure: Secondary | ICD-10-CM | POA: Diagnosis present

## 2021-02-08 DIAGNOSIS — E872 Acidosis, unspecified: Secondary | ICD-10-CM | POA: Diagnosis present

## 2021-02-08 DIAGNOSIS — I4891 Unspecified atrial fibrillation: Secondary | ICD-10-CM | POA: Diagnosis not present

## 2021-02-08 DIAGNOSIS — Z8249 Family history of ischemic heart disease and other diseases of the circulatory system: Secondary | ICD-10-CM

## 2021-02-08 DIAGNOSIS — Z20822 Contact with and (suspected) exposure to covid-19: Secondary | ICD-10-CM | POA: Diagnosis present

## 2021-02-08 DIAGNOSIS — R8281 Pyuria: Secondary | ICD-10-CM | POA: Diagnosis present

## 2021-02-08 DIAGNOSIS — E663 Overweight: Secondary | ICD-10-CM | POA: Diagnosis present

## 2021-02-08 DIAGNOSIS — K529 Noninfective gastroenteritis and colitis, unspecified: Secondary | ICD-10-CM

## 2021-02-08 DIAGNOSIS — L400 Psoriasis vulgaris: Secondary | ICD-10-CM | POA: Diagnosis present

## 2021-02-08 DIAGNOSIS — Z823 Family history of stroke: Secondary | ICD-10-CM

## 2021-02-08 DIAGNOSIS — Z6830 Body mass index (BMI) 30.0-30.9, adult: Secondary | ICD-10-CM

## 2021-02-08 DIAGNOSIS — I499 Cardiac arrhythmia, unspecified: Secondary | ICD-10-CM | POA: Diagnosis present

## 2021-02-08 DIAGNOSIS — Z79899 Other long term (current) drug therapy: Secondary | ICD-10-CM

## 2021-02-08 LAB — COMPREHENSIVE METABOLIC PANEL
ALT: 31 U/L (ref 0–44)
AST: 27 U/L (ref 15–41)
Albumin: 3.9 g/dL (ref 3.5–5.0)
Alkaline Phosphatase: 94 U/L (ref 38–126)
Anion gap: 12 (ref 5–15)
BUN: 10 mg/dL (ref 6–20)
CO2: 28 mmol/L (ref 22–32)
Calcium: 9.2 mg/dL (ref 8.9–10.3)
Chloride: 100 mmol/L (ref 98–111)
Creatinine, Ser: 0.73 mg/dL (ref 0.44–1.00)
GFR, Estimated: >60 mL/min (ref >60)
Glucose, Bld: 108 mg/dL — ABNORMAL HIGH (ref 70–99)
Potassium: 3.5 mmol/L (ref 3.5–5.1)
Sodium: 140 mmol/L (ref 135–145)
Total Bilirubin: 1 mg/dL (ref 0.3–1.2)
Total Protein: 7.4 g/dL (ref 6.5–8.1)

## 2021-02-08 LAB — URINALYSIS, COMPLETE (UACMP) WITH MICROSCOPIC
Bacteria, UA: NONE SEEN
Bilirubin Urine: NEGATIVE
Glucose, UA: NEGATIVE mg/dL
Hgb urine dipstick: NEGATIVE
Ketones, ur: NEGATIVE mg/dL
Nitrite: NEGATIVE
Protein, ur: NEGATIVE mg/dL
Specific Gravity, Urine: <1.005 — ABNORMAL LOW (ref 1.005–1.030)
pH: 6 (ref 5.0–8.0)

## 2021-02-08 LAB — TSH: TSH: 0.928 u[IU]/mL (ref 0.350–4.500)

## 2021-02-08 LAB — CBC WITH DIFFERENTIAL/PLATELET
Abs Immature Granulocytes: 0.04 10*3/uL (ref 0.00–0.07)
Basophils Absolute: 0 10*3/uL (ref 0.0–0.1)
Basophils Relative: 0 %
Eosinophils Absolute: 0 10*3/uL (ref 0.0–0.5)
Eosinophils Relative: 0 %
HCT: 47.5 % — ABNORMAL HIGH (ref 36.0–46.0)
Hemoglobin: 15.8 g/dL — ABNORMAL HIGH (ref 12.0–15.0)
Immature Granulocytes: 0 %
Lymphocytes Relative: 28 %
Lymphs Abs: 3.5 10*3/uL (ref 0.7–4.0)
MCH: 29.3 pg (ref 26.0–34.0)
MCHC: 33.3 g/dL (ref 30.0–36.0)
MCV: 88.1 fL (ref 80.0–100.0)
Monocytes Absolute: 1.1 10*3/uL — ABNORMAL HIGH (ref 0.1–1.0)
Monocytes Relative: 9 %
Neutro Abs: 8 10*3/uL — ABNORMAL HIGH (ref 1.7–7.7)
Neutrophils Relative %: 63 %
Platelets: 264 10*3/uL (ref 150–400)
RBC: 5.39 MIL/uL — ABNORMAL HIGH (ref 3.87–5.11)
RDW: 12.8 % (ref 11.5–15.5)
WBC: 12.6 10*3/uL — ABNORMAL HIGH (ref 4.0–10.5)
nRBC: 0 % (ref 0.0–0.2)

## 2021-02-08 LAB — D-DIMER, QUANTITATIVE: D-Dimer, Quant: 0.46 ug/mL-FEU (ref 0.00–0.50)

## 2021-02-08 LAB — RESP PANEL BY RT-PCR (FLU A&B, COVID) ARPGX2
Influenza A by PCR: NEGATIVE
Influenza B by PCR: NEGATIVE
SARS Coronavirus 2 by RT PCR: NEGATIVE

## 2021-02-08 LAB — LACTIC ACID, PLASMA
Lactic Acid, Venous: 2 mmol/L (ref 0.5–1.9)
Lactic Acid, Venous: 1.1 mmol/L (ref 0.5–1.9)

## 2021-02-08 LAB — MAGNESIUM
Magnesium: 2 mg/dL (ref 1.7–2.4)
Magnesium: 2.1 mg/dL (ref 1.7–2.4)

## 2021-02-08 LAB — TROPONIN I (HIGH SENSITIVITY): Troponin I (High Sensitivity): 7 ng/L (ref <18)

## 2021-02-08 LAB — PROTIME-INR
INR: 1 (ref 0.8–1.2)
Prothrombin Time: 13.4 seconds (ref 11.4–15.2)

## 2021-02-08 LAB — PROCALCITONIN: Procalcitonin: <0.1 ng/mL

## 2021-02-08 LAB — APTT: aPTT: 25 seconds (ref 24–36)

## 2021-02-08 LAB — BRAIN NATRIURETIC PEPTIDE: B Natriuretic Peptide: 245.9 pg/mL — ABNORMAL HIGH (ref 0.0–100.0)

## 2021-02-08 MED ORDER — ONDANSETRON HCL 4 MG PO TABS: 4.0000 mg | ORAL_TABLET | Freq: Four times a day (QID) | ORAL | Status: DC | PRN

## 2021-02-08 MED ORDER — ONDANSETRON HCL 4 MG/2ML IJ SOLN: 4.0000 mg | Freq: Four times a day (QID) | INTRAMUSCULAR | Status: DC | PRN

## 2021-02-08 MED ORDER — ACETAMINOPHEN 325 MG PO TABS: 650.0000 mg | ORAL_TABLET | Freq: Four times a day (QID) | ORAL | Status: DC | PRN

## 2021-02-08 MED ORDER — LACTATED RINGERS IV BOLUS
1000.0000 mL | Freq: Once | INTRAVENOUS | Status: AC
  Administered 2021-02-08: 1000 mL via INTRAVENOUS

## 2021-02-08 MED ORDER — ACETAMINOPHEN 650 MG RE SUPP: 650.0000 mg | Freq: Four times a day (QID) | RECTAL | Status: DC | PRN

## 2021-02-08 MED ORDER — DILTIAZEM LOAD VIA INFUSION
10.0000 mg | Freq: Once | INTRAVENOUS | Status: AC
Start: 2021-02-08 — End: 2021-02-08
  Administered 2021-02-08: 10 mg via INTRAVENOUS
  Filled 2021-02-08: qty 10

## 2021-02-08 MED ORDER — ENOXAPARIN SODIUM 40 MG/0.4ML IJ SOSY
40.0000 mg | PREFILLED_SYRINGE | INTRAMUSCULAR | Status: DC
  Administered 2021-02-08: 40 mg via SUBCUTANEOUS
  Filled 2021-02-08: qty 0.4

## 2021-02-08 MED ORDER — DILTIAZEM HCL-DEXTROSE 125-5 MG/125ML-% IV SOLN (PREMIX)
5.0000 mg/h | INTRAVENOUS | Status: DC
  Administered 2021-02-08: 5 mg/h via INTRAVENOUS
  Filled 2021-02-08 (×2): qty 125

## 2021-02-08 NOTE — Progress Notes (Signed)
BP 130/70    Pulse (!) 55    Temp 98.2 F (36.8 C) (Oral)    Ht 5' 8.9" (1.75 m)    Wt 201 lb 6.4 oz (91.4 kg)    SpO2 97%    BMI 29.83 kg/m    Subjective:    Patient ID: Courtney Walsh, female    DOB: July 21, 1970, 51 y.o.   MRN: [id number]  Chief Complaint  Patient presents with   Dizziness    Has been having dizziness, and nausea Symptoms started last week, believes she had the flu, tested neg at home test for covid x 2 and was neg, last Tuesday and yesterday.     HPI: Courtney Walsh is a 51 y.o. female  Dizziness This is a new (worse when she is up for a period of time a little dizzy) problem. The current episode started in the past 7 days (yesterday and a few days ago, if she is up for a few hours got worse .). The problem occurs intermittently. Associated symptoms include chills, congestion, fatigue, headaches and weakness. Pertinent negatives include no abdominal pain, anorexia, arthralgias, change in bowel habit, chest pain, coughing, diaphoresis, fever, joint swelling, myalgias, nausea, neck pain, numbness, rash, sore throat, swollen glands, urinary symptoms, vertigo, visual change or vomiting. Associated symptoms comments: Has a fever beginning of last week - had a URI cough / body aches, Nausea and Vomiting x 2 with beverages, home test for COVID.   Chief Complaint  Patient presents with   Dizziness    Has been having dizziness, and nausea Symptoms started last week, believes she had the flu, tested neg at home test for covid x 2 and was neg, last Tuesday and yesterday.     Relevant past medical, surgical, family and social history reviewed and updated as indicated. Interim medical history since our last visit reviewed. Allergies and medications reviewed and updated.  Review of Systems  Constitutional:  Positive for chills and fatigue. Negative for diaphoresis and fever.  HENT:  Positive for congestion. Negative for sore throat.   Respiratory:  Negative for cough.    Cardiovascular:  Negative for chest pain.  Gastrointestinal:  Negative for abdominal pain, anorexia, change in bowel habit, nausea and vomiting.  Musculoskeletal:  Negative for arthralgias, joint swelling, myalgias and neck pain.  Skin:  Negative for rash.  Neurological:  Positive for dizziness, weakness and headaches. Negative for vertigo and numbness.   Per HPI unless specifically indicated above     Objective:    BP 130/70    Pulse (!) 55    Temp 98.2 F (36.8 C) (Oral)    Ht 5' 8.9" (1.75 m)    Wt 201 lb 6.4 oz (91.4 kg)    SpO2 97%    BMI 29.83 kg/m   Wt Readings from Last 3 Encounters:  02/08/21 201 lb 6.4 oz (91.4 kg)  03/05/20 199 lb 6.4 oz (90.4 kg)  09/01/19 193 lb 3.2 oz (87.6 kg)    Physical Exam Vitals and nursing note reviewed.  Constitutional:      General: She is not in acute distress.    Appearance: Normal appearance. She is not ill-appearing or diaphoretic.  Eyes:     Conjunctiva/sclera: Conjunctivae normal.  Cardiovascular:     Rate and Rhythm: Tachycardia present.  Pulmonary:     Effort: No respiratory distress.     Breath sounds: No stridor. No wheezing, rhonchi or rales.  Chest:     Chest wall: No tenderness.  Abdominal:     General: Abdomen is flat. Bowel sounds are normal. There is no distension.     Palpations: Abdomen is soft. There is no mass.     Tenderness: There is no abdominal tenderness. There is no guarding.  Skin:    General: Skin is warm and dry.     Coloration: Skin is not jaundiced.     Findings: No erythema.  Neurological:     Mental Status: She is alert.    Results for orders placed or performed in visit on 03/05/20  Comprehensive metabolic panel  Result Value Ref Range   Glucose 74 65 - 99 mg/dL   BUN 14 6 - 24 mg/dL   Creatinine, Ser 2.87 0.57 - 1.00 mg/dL   GFR calc non Af Amer 104 >59 mL/min/1.73   GFR calc Af Amer 119 >59 mL/min/1.73   BUN/Creatinine Ratio 22 9 - 23   Sodium 138 134 - 144 mmol/L   Potassium 3.5 3.5 -  5.2 mmol/L   Chloride 97 96 - 106 mmol/L   CO2 25 20 - 29 mmol/L   Calcium 9.6 8.7 - 10.2 mg/dL   Total Protein 7.4 6.0 - 8.5 g/dL   Albumin 4.2 3.8 - 4.9 g/dL   Globulin, Total 3.2 1.5 - 4.5 g/dL   Albumin/Globulin Ratio 1.3 1.2 - 2.2   Bilirubin Total 0.7 0.0 - 1.2 mg/dL   Alkaline Phosphatase 128 (H) 44 - 121 IU/L   AST 24 0 - 40 IU/L   ALT 26 0 - 32 IU/L  TSH  Result Value Ref Range   TSH 2.090 0.450 - 4.500 uIU/mL        Current Outpatient Medications:    hydrochlorothiazide (HYDRODIURIL) 25 MG tablet, Take 1 tablet (25 mg total) by mouth daily., Disp: 90 tablet, Rfl: 4   norethindrone (MICRONOR) 0.35 MG tablet, TAKE 1 TABLET BY MOUTH EVERY DAY, Disp: 84 tablet, Rfl: 0    Assessment & Plan:  Patient was found to be in A. fib with RVR : At the office today will be sent to the ER EMS HR between high 100s - 200's in the office. Co nause/ dizziness   Problem List Items Addressed This Visit   None Visit Diagnoses     Abnormal heart rate    -  Primary   Relevant Orders   EKG 12-Lead (Completed)        Orders Placed This Encounter  Procedures   EKG 12-Lead     No orders of the defined types were placed in this encounter.    Follow up plan: No follow-ups on file.

## 2021-02-08 NOTE — ED Provider Notes (Signed)
[facility] Provider Note    None    (approximate)   History   Tachycardia (A fib rvr, sob, dizziness )   HPI  Courtney Walsh is a 51 y.o. female with past medical history of hypertension and reported irregular heartbeat who presents for assessment of consolation of symptoms including cough, congestion, diarrhea fatigue and malaise and further evaluation of concern for A. fib with RVR seen at PCPs office earlier today.  Patient has never had this before.  She states she started having the above symptoms about a week ago initially had some nausea and vomiting as well but now mostly just had some diarrhea.  He denies any abdominal pain, chest pain or current back pain although she states that when she was coughing she is a little bit of left upper back pain yesterday.  No new headache, earache, sore throat, urinary symptoms rash or extremity pain.  She denies any known cardiac or pulmonary history.  Denies EtOH use illicit drug use or tobacco abuse.  No other acute concerns at this time.   Past Medical History:  Diagnosis Date   Hypertension    Irregular heart beat    Overweight    Thyroid cyst October 2015   Left lobe-6 mm.         Physical Exam  Triage Vital Signs: ED Triage Vitals [02/08/21 1627]  Enc Vitals Group     BP 135/74     Pulse Rate (!) 150     Resp 17     Temp 98.5 F (36.9 C)     Temp Source Oral     SpO2 96 %     Weight      Height      Head Circumference      Peak Flow      Pain Score 0     Pain Loc      Pain Edu?      Excl. in GC?     Most recent vital signs: Vitals:   02/08/21 1900 02/08/21 2030  BP: 101/78 113/86  Pulse: 69 (!) 102  Resp: (!) 26 15  Temp:    SpO2: 94% 98%    General: Awake, no distress.  CV:  Good peripheral perfusion.  Tachycardic.  No murmurs rubs or gallops.  Slightly prolonged capillary refill in the 50s. Resp:  Normal effort.  Clear bilaterally. Abd:  No distention.  Soft  throughout. Other:  No significant lower extreme edema.  Patient's mucous membranes do appear dry.   ED Results / Procedures / Treatments  Labs (all labs ordered are listed, but only abnormal results are displayed) Labs Reviewed  CBC WITH DIFFERENTIAL/PLATELET - Abnormal; Notable for the following components:      Result Value   WBC 12.6 (*)    RBC 5.39 (*)    Hemoglobin 15.8 (*)    HCT 47.5 (*)    Neutro Abs 8.0 (*)    Monocytes Absolute 1.1 (*)    All other components within normal limits  COMPREHENSIVE METABOLIC PANEL - Abnormal; Notable for the following components:   Glucose, Bld 108 (*)    All other components within normal limits  LACTIC ACID, PLASMA - Abnormal; Notable for the following components:   Lactic Acid, Venous 2.0 (*)    All other components within normal limits  URINALYSIS, COMPLETE (UACMP) WITH MICROSCOPIC - Abnormal; Notable for the following components:   Specific Gravity, Urine <1.005 (*)    Leukocytes,Ua SMALL (*)  All other components within normal limits  RESP PANEL BY RT-PCR (FLU A&B, COVID) ARPGX2  CULTURE, BLOOD (ROUTINE X 2)  CULTURE, BLOOD (ROUTINE X 2)  MAGNESIUM  PROCALCITONIN  D-DIMER, QUANTITATIVE  LACTIC ACID, PLASMA  PROTIME-INR  APTT  POC URINE PREG, ED  TROPONIN I (HIGH SENSITIVITY)     EKG  ECG is marked for A. fib with a ventricular rate of 102, normal axis, unremarkable intervals with PVC in anterior leads and no other clear evidence of acute ischemia or significant arrhythmia.   RADIOLOGY Chest reviewed by myself shows no focal consoidation, effusion, edema, pneumothorax or other clear acute thoracic process. I also reviewed radiology interpretation and agree with findings described.    PROCEDURES:  Critical Care performed: Yes, see critical care procedure note(s)  .Critical Care Performed by: Gilles Chiquito, MD Authorized by: Gilles Chiquito, MD   Critical care provider statement:    Critical care time  (minutes):  75   Critical care was necessary to treat or prevent imminent or life-threatening deterioration of the following conditions:  Cardiac failure   Critical care was time spent personally by me on the following activities:  Development of treatment plan with patient or surrogate, discussions with consultants, evaluation of patient's response to treatment, examination of patient, ordering and review of laboratory studies, ordering and review of radiographic studies, ordering and performing treatments and interventions, pulse oximetry, re-evaluation of patient's condition and review of old charts    MEDICATIONS ORDERED IN ED: Medications  diltiazem (CARDIZEM) 1 mg/mL load via infusion 10 mg (10 mg Intravenous Bolus from Bag 02/08/21 2106)    And  diltiazem (CARDIZEM) 125 mg in dextrose 5% 125 mL (1 mg/mL) infusion (5 mg/hr Intravenous New Bag/Given 02/08/21 2106)  lactated ringers bolus 1,000 mL (0 mLs Intravenous Stopped 02/08/21 1810)  lactated ringers bolus 1,000 mL (0 mLs Intravenous Stopped 02/08/21 2108)     IMPRESSION / MDM / ASSESSMENT AND PLAN / ED COURSE  I reviewed the triage vital signs and the nursing notes.                              Differential diagnosis includes, but is not limited to symptoms and possibly new A. fib related to sepsis, anemia, metabolic derangements, hyperthyroidism with low suspicion at this time for toxic ingestion or alcohol withdrawal.  ECG is marked for A. fib with a ventricular rate of 102, normal axis, unremarkable intervals with PVC in anterior leads and no other clear evidence of acute ischemia or significant arrhythmia.  Patient is denying any chest pain and given troponin of 70 but very low suspicion for ACS or myocarditis.  Chest reviewed by myself shows no focal consoidation, effusion, edema, pneumothorax or other clear acute thoracic process. I also reviewed radiology interpretation and agree with findings described.  Procalcitonin is  undetectable.  Magnesium is within normal limits.  Suspicion for PE as D-dimer 0.46.  UA is unremarkable for evidence of infection.  Initial lactic acid slightly elevated 2 which down trended to 1.1 after some IV fluids.  Coagulation studies unremarkable.  I initially hydrated patient to see if this would resolve her tachycardia after 2 L of fluid she is still intermittently tachycardic with irregular rhythm on the monitor with rates in the 130s to 140s.  At this point we will start her on diltiazem drip following a bolus and admit to medicine service for further evaluation management of apparent new  A. fib in the setting of what sounds very much like an acute viral illness with bronchitic and gastroenteritis components.  On reassessment patient is still denying any other acute complaints or concerns.  I discussed this with admitting hospitalist who will place admission orders.      FINAL CLINICAL IMPRESSION(S) / ED DIAGNOSES   Final diagnoses:  Atrial fibrillation with RVR (HCC)  Bronchitis  Gastroenteritis  Dehydration     Rx / DC Orders   ED Discharge Orders     None        Note:  This document was prepared using Dragon voice recognition software and may include unintentional dictation errors.   Gilles ChiquitoSmith, Brieonna Crutcher P, MD 02/08/21 2110

## 2021-02-08 NOTE — H&P (Signed)
History and Physical   Courtney Walsh [id number] DOB: 06-Feb-1970 DOA: 02/08/2021  PCP: Venita Lick, NP  Outpatient Specialists: Dr. Prentice Docker, OBGyn Patient coming from: Walk-in clinic  I have personally briefly reviewed patient's old medical records in [place].  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  Vitals in the emergency department showed temperature of 98.2, respiration rate of 17, heart rate of 55, blood pressure 130/90, SPO2 of 97% on room air. Exam EKG in the emergency department showed atrial fibrillation with a rate of 131, QTc of 504.  Labs in the emergency department showed serum sodium 140, potassium 3.5, chloride 100, bicarb 28, BUN of 10, serum creatinine of 0.73, nonfasting blood glucose 108, GFR greater than 60, high sensitive troponin was 7.  Lactic acid was initially mildly elevated at 2.0 and improved to 1.1.  Procalcitonin was less than 0.10.  WBC was mildly elevated at 12.6, hemoglobin 15.8, platelets of 264.  INR was 1.0, PT was 13.4, PTT was 25.  Blood cultures have been collected and are pending.  UA was positive for small leukocytes.  In the emergency department patient was started on Cardizem gtt.  At bedside she is able to tell me her name, age, identify her husband at bedside, and the current calendar year.   She reports flu-like that started about 1 week ago. She states that she developed muscle aches, diarrhea, nausea, coughing, vomited twice. She endorsed T-max was 101, she took ibuprofen which helped to resolve the fever. She has known sick contacts, stating that her husband was sick with similar symptoms, though was not swabbed for anything.   She reports the  vomiting was liquid substances and denies black and red vomitus. She states the vomiting was early on in the week and that she has not vomited in several days. She reports the diarrhea was early on as well.   She felt dizzy and lightheadness in the last few days. She reports that she nearly passed out in the shower yesterday thus prompting her to present for further evaluation.   She reports she has never felt this way before.  She denies dysuria, hematuria, urgency, frequency. She reports one episode of loose stool on day of admission.   Family history: Patient states that her father and twin sister has had prior diagnosis of irregular heart rhythm including atrial fibrillation  Social history: She lives at home with husband. She denies tobacco use, recreational drug use. She endorses infrequent etoh, last drink was about three weeks. She works in [employer] research (medical).   Vaccination history: She is not vaccinated for covid 19 or influenza.  ROS: Constitutional: no weight change, + fever ENT/Mouth: no sore throat, no rhinorrhea Eyes: no eye pain, no vision changes Cardiovascular: no chest pain, no dyspnea,  no edema, no palpitations Respiratory: + cough, no sputum, no wheezing Gastrointestinal: + nausea, + vomiting, + diarrhea, no constipation Genitourinary: no urinary incontinence, no dysuria, no hematuria Musculoskeletal: no arthralgias, no myalgias Skin: no skin lesions, no pruritus, Neuro: + weakness, no loss of consciousness, no syncope Psych: no anxiety, no depression, + decrease appetite Heme/Lymph: no bruising, no bleeding  ED Course: Discussed with emergency medicine provider, patient requiring hospitalization for chief concerns of atrial fibrillation with RVR.  Assessment/Plan  Principal Problem:  Atrial fibrillation with RVR (HCC) Active Problems:   Essential hypertension, benign   Irregular heartbeat   Psoriasis   Pyuria    Cardiovascular and Mediastinum * Atrial fibrillation  with RVR (HCC) Assessment & Plan - No prior diagnosis of atrial fibrillation - Complete echo ordered - Continue diltiazem gtt. - Etiology work-up in progress at this time; troponin was negative, procalcitonin was negative, lactic acid resolved - Presumed secondary to upper respiratory infection - TSH is pending; blood cultures x2 are pending - Check a urine culture - Check BNP - Admit to progressive cardiac, observation, telemetry  Essential hypertension, benign Assessment & Plan - Resumed home hydrochlorothiazide 25 mg daily  Other Pyuria Assessment & Plan - Present on admission - Patient denies dysuria, hematuria, urgency, frequency - No clinical indication for antibiotic at this time  Chart reviewed.   DVT prophylaxis: Enoxaparin Code Status: full code Diet: heart healthy Family Communication: updated husband at bedside  Disposition Plan: pending clinical course Consults called: None at this time Admission status: Progressive cardiac, observation, telemetry  Past Medical History:  Diagnosis Date   Hypertension    Irregular heart beat    Overweight    Thyroid cyst October 2015   Left lobe-6 mm.   Past Surgical History:  Procedure Laterality Date   NO PAST SURGERIES     Social History:  reports that she has quit smoking. She has never used smokeless tobacco. She reports that she does not currently use alcohol. She reports that she does not use drugs.  No Known Allergies Family History  Problem Relation Age of Onset   Hypertension Mother    Hypertension Father    Stroke Father    Heart disease Maternal Grandfather    Heart disease Paternal Grandmother    Hypertension Sister    Breast cancer Paternal Aunt 86       contact   Cancer Neg Hx    Diabetes Neg Hx    COPD Neg Hx    Family history: Family history reviewed and not pertinent.  Prior to Admission medications   Medication Sig Start Date End Date Taking? Authorizing Provider  hydrochlorothiazide  (HYDRODIURIL) 25 MG tablet Take 1 tablet (25 mg total) by mouth daily. 03/05/20  Yes Marnee Guarneri T, NP  norethindrone (MICRONOR) 0.35 MG tablet TAKE 1 TABLET BY MOUTH EVERY DAY 11/03/20  Yes Will Bonnet, MD   Physical Exam: Vitals:   02/08/21 1825 02/08/21 1900 02/08/21 2030 02/08/21 2130  BP: 130/71 101/78 113/86 (!) 110/94  Pulse: (!) 120 69 (!) 102 87  Resp: (!) 32 (!) 26 15 12   Temp: 98.8 F (37.1 C)     TempSrc: Oral     SpO2: 96% 94% 98% 98%  Weight:      Height:       Constitutional: appears age-appropriate, NAD, calm, comfortable Eyes: PERRL, lids and conjunctivae normal ENMT: Mucous membranes are moist. Posterior pharynx clear of any exudate or lesions. Age-appropriate dentition. Hearing appropriate Neck: normal, supple, no masses, no thyromegaly Respiratory: clear to auscultation bilaterally, no wheezing, no crackles. Normal respiratory effort. No accessory muscle use.  Cardiovascular: Regular rate and rhythm, no murmurs / rubs / gallops. No extremity edema. 2+ pedal pulses. No carotid bruits.  Abdomen: Obese abdomen, no tenderness, no masses palpated, no hepatosplenomegaly. Bowel sounds positive.  Musculoskeletal: no clubbing / cyanosis. No joint deformity upper and lower extremities. Good ROM, no contractures, no atrophy. Normal muscle tone.  Skin: no rashes, lesions, ulcers. No induration Neurologic:  Sensation intact. Strength 5/5 in all 4.  Psychiatric: Normal judgment and insight. Alert and oriented x 3. Normal mood.   EKG: independently reviewed, showing atrial fibrillation initially rate of 102, QTc 415.  Second EKG showed atrial fibrillation with rate of 131, QTc of 504.  Chest x-ray on Admission: I personally reviewed and I agree with radiologist reading as below.  DG Chest 2 View  Result Date: 02/08/2021 CLINICAL DATA:  Atrial fibrillation. Shortness of breath and dizziness. EXAM: CHEST - 2 VIEW COMPARISON:  None. FINDINGS: The heart size and  mediastinal contours are within normal limits. Normal pulmonary vascularity. No focal consolidation, pleural effusion, or pneumothorax. Small calcified granuloma in the left lower lobe. No acute osseous abnormality. IMPRESSION: 1. No acute cardiopulmonary disease. Electronically Signed   By: Titus Dubin M.D.   On: 02/08/2021 17:24    Labs on Admission: I have personally reviewed following labs  CBC: Recent Labs  Lab 02/08/21 1631  WBC 12.6*  NEUTROABS 8.0*  HGB 15.8*  HCT 47.5*  MCV 88.1  PLT [id number]   Basic Metabolic Panel: Recent Labs  Lab 02/08/21 1631  NA 140  K 3.5  CL 100  CO2 28  GLUCOSE 108*  BUN 10  CREATININE 0.73  CALCIUM 9.2  MG 2.1   2.0   GFR: Estimated Creatinine Clearance: 95.7 mL/min (by C-G formula based on SCr of 0.73 mg/dL).  Liver Function Tests: Recent Labs  Lab 02/08/21 1631  AST 27  ALT 31  ALKPHOS 94  BILITOT 1.0  PROT 7.4  ALBUMIN 3.9   Coagulation Profile: Recent Labs  Lab 02/08/21 2000  INR 1.0   Urine analysis:    Component Value Date/Time   COLORURINE YELLOW 02/08/2021 1707   APPEARANCEUR CLEAR 02/08/2021 1707   LABSPEC <1.005 (L) 02/08/2021 1707   PHURINE 6.0 02/08/2021 1707   GLUCOSEU NEGATIVE 02/08/2021 1707   HGBUR NEGATIVE 02/08/2021 1707   BILIRUBINUR NEGATIVE 02/08/2021 1707   KETONESUR NEGATIVE 02/08/2021 1707   PROTEINUR NEGATIVE 02/08/2021 1707   NITRITE NEGATIVE 02/08/2021 1707   LEUKOCYTESUR SMALL (A) 02/08/2021 1707   Dr. Tobie Poet [facility]  If 7PM-7AM, please contact overnight-coverage provider If 7AM-7PM, please contact day coverage provider [email or web address]  02/09/2021, 12:18 AM

## 2021-02-08 NOTE — ED Notes (Signed)
Provider at bedside

## 2021-02-08 NOTE — Assessment & Plan Note (Addendum)
-   No prior diagnosis of atrial fibrillation - Complete echo ordered - Continue diltiazem gtt. - Etiology work-up in progress at this time; troponin was negative, procalcitonin was negative, lactic acid resolved - Presumed secondary to upper respiratory infection - TSH is pending; blood cultures x2 are pending - Check a urine culture - Check BNP - Admit to progressive cardiac, observation, telemetry

## 2021-02-08 NOTE — Hospital Course (Signed)
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  Vitals in the emergency department showed temperature of 98.2, respiration rate of 17, heart rate of 55, blood pressure 130/90, SPO2 of 97% on room air. Exam EKG in the emergency department showed atrial fibrillation with a rate of 131, QTc of 504.  Labs in the emergency department showed serum sodium 140, potassium 3.5, chloride 100, bicarb 28, BUN of 10, serum creatinine of 0.73, nonfasting blood glucose 108, GFR greater than 60, high sensitive troponin was 7.  Lactic acid was initially mildly elevated at 2.0 and improved to 1.1.  Procalcitonin was less than 0.10.  WBC was mildly elevated at 12.6, hemoglobin 15.8, platelets of 264.  INR was 1.0, PT was 13.4, PTT was 25.  Blood cultures have been collected and are pending.  UA was positive for small leukocytes.  In the emergency department patient was started on Cardizem gtt.

## 2021-02-08 NOTE — ED Triage Notes (Signed)
A fib rvr , sob, dizziness

## 2021-02-09 ENCOUNTER — Observation Stay (HOSPITAL_BASED_OUTPATIENT_CLINIC_OR_DEPARTMENT_OTHER)
Admit: 2021-02-09 | Discharge: 2021-02-09 | Disposition: A | Discharge status: Home / Self Care | Payer: BC Managed Care – PPO | Attending: Internal Medicine | Admitting: Internal Medicine

## 2021-02-09 DIAGNOSIS — I4891 Unspecified atrial fibrillation: Secondary | ICD-10-CM

## 2021-02-09 LAB — BASIC METABOLIC PANEL
Anion gap: 7 (ref 5–15)
BUN: 8 mg/dL (ref 6–20)
CO2: 26 mmol/L (ref 22–32)
Calcium: 8.5 mg/dL — ABNORMAL LOW (ref 8.9–10.3)
Chloride: 106 mmol/L (ref 98–111)
Creatinine, Ser: 0.64 mg/dL (ref 0.44–1.00)
GFR, Estimated: >60 mL/min (ref >60)
Glucose, Bld: 92 mg/dL (ref 70–99)
Potassium: 3.1 mmol/L — ABNORMAL LOW (ref 3.5–5.1)
Sodium: 139 mmol/L (ref 135–145)

## 2021-02-09 LAB — ECHOCARDIOGRAM COMPLETE
AR max vel: 2.5 cm2
AV Area VTI: 2.61 cm2
AV Area mean vel: 2.22 cm2
AV Mean grad: 2 mmHg
AV Peak grad: 3.8 mmHg
Ao pk vel: 0.97 m/s
Area-P 1/2: 6.71 cm2
Height: 69 in
S' Lateral: 4.3 cm
Weight: 2998.26 oz

## 2021-02-09 LAB — BLOOD CULTURE ID PANEL (REFLEXED) - BCID2

## 2021-02-09 LAB — CBC
HCT: 42.9 % (ref 36.0–46.0)
Hemoglobin: 14.4 g/dL (ref 12.0–15.0)
MCH: 28.9 pg (ref 26.0–34.0)
MCHC: 33.6 g/dL (ref 30.0–36.0)
MCV: 86 fL (ref 80.0–100.0)
Platelets: 210 10*3/uL (ref 150–400)
RBC: 4.99 MIL/uL (ref 3.87–5.11)
RDW: 12.8 % (ref 11.5–15.5)
WBC: 9.6 10*3/uL (ref 4.0–10.5)
nRBC: 0 % (ref 0.0–0.2)

## 2021-02-09 LAB — PREGNANCY, URINE: Preg Test, Ur: NEGATIVE

## 2021-02-09 LAB — PROTIME-INR
INR: 1.1 (ref 0.8–1.2)
Prothrombin Time: 13.9 s (ref 11.4–15.2)

## 2021-02-09 LAB — HIV ANTIBODY (ROUTINE TESTING W REFLEX): HIV Screen 4th Generation wRfx: NONREACTIVE

## 2021-02-09 MED ORDER — BENZONATATE 100 MG PO CAPS
100.0000 mg | ORAL_CAPSULE | Freq: Three times a day (TID) | ORAL | Status: DC
  Administered 2021-02-09 – 2021-02-10 (×4): 100 mg via ORAL
  Filled 2021-02-09 (×4): qty 1

## 2021-02-09 MED ORDER — HYDROCHLOROTHIAZIDE 25 MG PO TABS
25.0000 mg | ORAL_TABLET | Freq: Every day | ORAL | Status: DC
  Administered 2021-02-09: 25 mg via ORAL
  Filled 2021-02-09: qty 1

## 2021-02-09 MED ORDER — NORETHINDRONE 0.35 MG PO TABS
1.0000 | ORAL_TABLET | Freq: Every day | ORAL | Status: DC
Start: 2021-02-09 — End: 2021-02-10

## 2021-02-09 MED ORDER — APIXABAN 5 MG PO TABS
5.0000 mg | ORAL_TABLET | Freq: Two times a day (BID) | ORAL | Status: DC
  Administered 2021-02-09 – 2021-02-10 (×3): 5 mg via ORAL
  Filled 2021-02-09 (×3): qty 1

## 2021-02-09 MED ORDER — METOPROLOL TARTRATE 25 MG PO TABS
25.0000 mg | ORAL_TABLET | Freq: Three times a day (TID) | ORAL | Status: DC
  Administered 2021-02-09 (×2): 25 mg via ORAL
  Filled 2021-02-09 (×2): qty 1

## 2021-02-09 MED ORDER — SODIUM CHLORIDE 0.9 % IV SOLN
INTRAVENOUS | Status: DC
  Administered 2021-02-10: 1000 mL via INTRAVENOUS

## 2021-02-09 MED ORDER — METOPROLOL TARTRATE 25 MG PO TABS: 25.0000 mg | ORAL_TABLET | Freq: Three times a day (TID) | ORAL | Status: DC

## 2021-02-09 MED ORDER — POTASSIUM CHLORIDE CRYS ER 20 MEQ PO TBCR
40.0000 meq | EXTENDED_RELEASE_TABLET | Freq: Once | ORAL | Status: AC
  Administered 2021-02-09: 40 meq via ORAL
  Filled 2021-02-09: qty 2

## 2021-02-09 MED ORDER — METOPROLOL TARTRATE 25 MG PO TABS
25.0000 mg | ORAL_TABLET | Freq: Two times a day (BID) | ORAL | Status: DC
  Administered 2021-02-09: 25 mg via ORAL
  Filled 2021-02-09: qty 1

## 2021-02-09 NOTE — ED Notes (Signed)
Admitting MD at BS.  

## 2021-02-09 NOTE — Assessment & Plan Note (Signed)
-   Present on admission - Patient denies dysuria, hematuria, urgency, frequency - No clinical indication for antibiotic at this time

## 2021-02-09 NOTE — Consult Note (Signed)
Cardiology Consultation:   Patient ID: Courtney Walsh MRN: [id number]; DOB: 1970-06-15  Admit date: 02/08/2021 Date of Consult: 02/09/2021  PCP:  Courtney Lick, NP   [facility] Providers Cardiologist:  New- Dr. Garen Walsh rounding   Patient Profile:   Courtney Walsh is a 51 y.o. female with a hx of hypertension who is being seen 02/09/2021 for the evaluation of atrial fibrillation at the request of Courtney Walsh.  History of Present Illness:   [name] is a 51 year old female with history of hypertension who presents to the hospital due to dizziness and muscle aches.  Patient states not feeling well over the past week.  2 days ago while taking a shower, she felt dizzy and almost passed out.  Scheduled appointment with primary care provider yesterday, EKG obtained in the office showed A. fib with rapid ventricular response, she was advised to come to the emergency room.  She denies chest pain, shortness of breath, palpitations, edema.  Denies any history of heart disease.  In the ED, EKG showed atrial fibrillation heart rate 170s.  Patient started on Cardizem drip with improvement in heart rate.   Past Medical History:  Diagnosis Date   Hypertension    Irregular heart beat    Overweight    Thyroid cyst October 2015   Left lobe-6 mm.    Past Surgical History:  Procedure Laterality Date   NO PAST SURGERIES       Home Medications:  Prior to Admission medications   Medication Sig Start Date End Date Taking? Authorizing Provider  hydrochlorothiazide (HYDRODIURIL) 25 MG tablet Take 1 tablet (25 mg total) by mouth daily. 03/05/20  Yes Courtney Guarneri T, NP  norethindrone (MICRONOR) 0.35 MG tablet TAKE 1 TABLET BY MOUTH EVERY DAY 11/03/20  Yes Courtney Bonnet, MD    Inpatient Medications: Scheduled Meds:  apixaban  5 mg Oral BID   benzonatate  100 mg Oral TID   metoprolol tartrate  25 mg Oral BID   norethindrone  1 tablet Oral Daily   Continuous Infusions:   diltiazem (CARDIZEM) infusion 10 mg/hr (02/09/21 1158)   PRN Meds: acetaminophen **OR** acetaminophen, ondansetron **OR** ondansetron (ZOFRAN) IV  Allergies:   No Known Allergies  Social History:   Social History   Socioeconomic History   Marital status: Married    Spouse name: Not on file   Number of children: Not on file   Years of education: Not on file   Highest education level: Not on file  Occupational History   Not on file  Tobacco Use   Smoking status: Former   Smokeless tobacco: Never  Vaping Use   Vaping Use: Never used  Substance and Sexual Activity   Alcohol use: Not Currently   Drug use: Never   Sexual activity: Yes    Partners: Male    Birth control/protection: Pill  Other Topics Concern   Not on file  Social History Narrative   Not on file   Social Determinants of Health   Financial Resource Strain: Not on file  Food Insecurity: Not on file  Transportation Needs: Not on file  Physical Activity: Not on file  Stress: Not on file  Social Connections: Not on file  Intimate Partner Violence: Not on file    Family History:    Family History  Problem Relation Age of Onset   Hypertension Mother    Hypertension Father    Stroke Father    Heart disease Maternal Grandfather    Heart disease Paternal  Grandmother    Hypertension Sister    Breast cancer Paternal Aunt 47       contact   Cancer Neg Hx    Diabetes Neg Hx    COPD Neg Hx      ROS:  Please see the history of present illness.   All other ROS reviewed and negative.     Physical Exam/Data:   Vitals:   02/09/21 0730 02/09/21 0830 02/09/21 1000 02/09/21 1130  BP: 127/87 109/86 (!) 108/92 (!) 127/96  Pulse: 80 (!) 103 (!) 128 82  Resp: 17 20 19  (!) 32  Temp:      TempSrc:      SpO2: 95% 99% 97% 97%  Weight:      Height:        Intake/Output Summary (Last 24 hours) at 02/09/2021 1428 Last data filed at 02/09/2021 0529 Gross per 24 hour  Intake 2049.53 ml  Output --  Net 2049.53 ml    Last 3 Weights 02/08/2021 02/08/2021 03/05/2020  Weight (lbs) 187 lb 6.3 oz 201 lb 6.4 oz 199 lb 6.4 oz  Weight (kg) 85 kg 91.354 kg 90.447 kg     Body mass index is 27.67 kg/m.  General:  Well nourished, well developed, in no acute distress HEENT: normal Neck: no JVD Vascular: No carotid bruits; Distal pulses 2+ bilaterally Cardiac: Irregularly irregular Lungs:  clear to auscultation bilaterally, no wheezing, rhonchi or rales  Abd: soft, nontender, no hepatomegaly  Ext: no edema Musculoskeletal:  No deformities, BUE and BLE strength normal and equal Skin: warm and dry  Neuro:  CNs 2-12 intact, no focal abnormalities noted Psych:  Normal affect   EKG:  The EKG was personally reviewed and demonstrates: Atrial fibrillation Telemetry:  Telemetry was personally reviewed and demonstrates: Atrial fibrillation  Relevant CV Studies:  TTE 02/09/2021 1. Left ventricular ejection fraction, by estimation, is 40 to 45%. The  left ventricle has mildly decreased function. The left ventricle  demonstrates global hypokinesis. The left ventricular internal cavity size  was mildly dilated. Left ventricular  diastolic parameters are indeterminate.   2. Right ventricular systolic function is normal. The right ventricular  size is normal.   3. Left atrial size was mildly dilated.   4. The mitral valve is normal in structure. Mild mitral valve  regurgitation.   5. The aortic valve is grossly normal. Aortic valve regurgitation is not  visualized.   Laboratory Data:  High Sensitivity Troponin:   Recent Labs  Lab 02/08/21 1631  TROPONINIHS 7     Chemistry Recent Labs  Lab 02/08/21 1631 02/09/21 0654  NA 140 139  K 3.5 3.1*  CL 100 106  CO2 28 26  GLUCOSE 108* 92  BUN 10 8  CREATININE 0.73 0.64  CALCIUM 9.2 8.5*  MG 2.1   2.0  --   GFRNONAA >60 >60  ANIONGAP 12 7    Recent Labs  Lab 02/08/21 1631  PROT 7.4  ALBUMIN 3.9  AST 27  ALT 31  ALKPHOS 94  BILITOT 1.0   Lipids No  results for input(s): CHOL, TRIG, HDL, LABVLDL, LDLCALC, CHOLHDL in the last 168 hours.  Hematology Recent Labs  Lab 02/08/21 1631 02/09/21 0654  WBC 12.6* 9.6  RBC 5.39* 4.99  HGB 15.8* 14.4  HCT 47.5* 42.9  MCV 88.1 86.0  MCH 29.3 28.9  MCHC 33.3 33.6  RDW 12.8 12.8  PLT 264 210   Thyroid  Recent Labs  Lab 02/08/21 1631  TSH 0.928  BNP Recent Labs  Lab 02/08/21 1631  BNP 245.9*    DDimer  Recent Labs  Lab 02/08/21 1631  DDIMER 0.46     Radiology/Studies:  DG Chest 2 View  Result Date: 02/08/2021 CLINICAL DATA:  Atrial fibrillation. Shortness of breath and dizziness. EXAM: CHEST - 2 VIEW COMPARISON:  None. FINDINGS: The heart size and mediastinal contours are within normal limits. Normal pulmonary vascularity. No focal consolidation, pleural effusion, or pneumothorax. Small calcified granuloma in the left lower lobe. No acute osseous abnormality. IMPRESSION: 1. No acute cardiopulmonary disease. Electronically Signed   By: Titus Dubin M.D.   On: 02/08/2021 17:24     Assessment and Plan:   New onset atrial fibrillation with RVR -CHA2DS2-VASc score 3 (chf, htn, gender) -Start Lopressor 3 times daily, titrate off diltiazem drip -Unsure if dizziness, syncopal symptoms were due to A. Fib -Plan for TEE guided cardioversion tomorrow. -Start Eliquis 5 mg twice daily -EP/A. fib clinic as outpatient. -N.p.o. midnight  2.  Cardiomyopathy EF 40 to 45% -Possibly tachycardia induced -DC cardioversion as above, consider switch to Toprol-XL  3.  Hypertension -Toprol-XL, stop HCTZ. -Consider adding ACE/ARB if EF stays low despite resumption of sinus rhythm.  Total encounter time more than 90 minutes  Greater than 50% was spent in counseling and coordination of care with the patient    Signed, Kate Sable, MD  02/09/2021 2:28 PM

## 2021-02-09 NOTE — ED Notes (Signed)
Back from b/r, no changes, steady gait, returned to monitor, VSS.

## 2021-02-09 NOTE — ED Notes (Addendum)
Pt alert, NAD, calm, interactive, resps e/u, speaking in clear complete sentences, VSS. Mentions HA, denies pother sx. Up to b/r, steady gait. Family at [facility]. Cardizem infusing.

## 2021-02-09 NOTE — Assessment & Plan Note (Addendum)
-   Resumed home hydrochlorothiazide 25 mg daily

## 2021-02-09 NOTE — ED Notes (Signed)
HR 67-90, afib, diltiazem decreased, metoprolol given

## 2021-02-09 NOTE — Progress Notes (Signed)
PHARMACY - PHYSICIAN COMMUNICATION CRITICAL VALUE ALERT - BLOOD CULTURE IDENTIFICATION (BCID)  Courtney Walsh is an 51 y.o. female who presented to [facility] on 02/08/2021 with a chief complaint of SOB, dizziness in Afib with RVR  Assessment:  blood cultures from 1/31 with GPC in 1 of 4 bottles, BCID detects Staphylococcus species (NOT S aureus or S epidermidis)  Name of physician (or Provider) Contacted: Dr Pietro Cassis  Current antibiotics: none  Changes to prescribed antibiotics recommended:  Recommendations accepted by provider - likely contaminant, monitor off antibiotics  Results for orders placed or performed during the hospital encounter of 02/08/21  Blood Culture ID Panel (Reflexed) (Collected: 02/08/2021  5:12 PM)  Result Value Ref Range   Enterococcus faecalis NOT DETECTED NOT DETECTED   Enterococcus Faecium NOT DETECTED NOT DETECTED   Listeria monocytogenes NOT DETECTED NOT DETECTED   Staphylococcus species DETECTED (A) NOT DETECTED   Staphylococcus aureus (BCID) NOT DETECTED NOT DETECTED   Staphylococcus epidermidis NOT DETECTED NOT DETECTED   Staphylococcus lugdunensis NOT DETECTED NOT DETECTED   Streptococcus species NOT DETECTED NOT DETECTED   Streptococcus agalactiae NOT DETECTED NOT DETECTED   Streptococcus pneumoniae NOT DETECTED NOT DETECTED   Streptococcus pyogenes NOT DETECTED NOT DETECTED   A.calcoaceticus-baumannii NOT DETECTED NOT DETECTED   Bacteroides fragilis NOT DETECTED NOT DETECTED   Enterobacterales NOT DETECTED NOT DETECTED   Enterobacter cloacae complex NOT DETECTED NOT DETECTED   Escherichia coli NOT DETECTED NOT DETECTED   Klebsiella aerogenes NOT DETECTED NOT DETECTED   Klebsiella oxytoca NOT DETECTED NOT DETECTED   Klebsiella pneumoniae NOT DETECTED NOT DETECTED   Proteus species NOT DETECTED NOT DETECTED   Salmonella species NOT DETECTED NOT DETECTED   Serratia marcescens NOT DETECTED NOT DETECTED   Haemophilus influenzae NOT DETECTED NOT  DETECTED   Neisseria meningitidis NOT DETECTED NOT DETECTED   Pseudomonas aeruginosa NOT DETECTED NOT DETECTED   Stenotrophomonas maltophilia NOT DETECTED NOT DETECTED   Candida albicans NOT DETECTED NOT DETECTED   Candida auris NOT DETECTED NOT DETECTED   Candida glabrata NOT DETECTED NOT DETECTED   Candida krusei NOT DETECTED NOT DETECTED   Candida parapsilosis NOT DETECTED NOT DETECTED   Candida tropicalis NOT DETECTED NOT DETECTED   Cryptococcus neoformans/gattii NOT DETECTED NOT DETECTED    Doreene Eland, PharmD, BCPS, BCIDP Work Cell: [phone] 02/09/2021 12:10 PM

## 2021-02-09 NOTE — Progress Notes (Signed)
*  PRELIMINARY RESULTS* Echocardiogram 2D Echocardiogram has been performed.  Courtney Walsh 02/09/2021, 10:56 AM

## 2021-02-09 NOTE — ED Notes (Signed)
No changes. Up to b/r. Family at [facility].

## 2021-02-09 NOTE — Progress Notes (Signed)
PROGRESS NOTE  Courtney Walsh  DOB: 1970-11-05  PCP: Venita Lick, NP CR:[id number]  DOA: 02/08/2021  LOS: 0 days  Hospital Day: 2  Chief Complaint  Patient presents with   Tachycardia    A fib rvr, sob, dizziness     Brief narrative: Courtney Walsh is a 51 y.o. female with PMH significant for hypertension, psoriasis. Patient was sent to the ED from walk-in clinic on 1/31 for A. fib with RVR. Patient states that she started having flulike symptoms about a week ago with muscle ache, diarrhea, nausea, vomiting, cough and a fever up to 101.  Her symptoms gradually improved but she continued to have lightheadedness and dizziness for few days. On 1/30, she nearly passed out in the shower after which she made an appointment with PCP for the next day on 1/31. In the PCPs office, she was noted to have A. fib with RVR.  She did not have any symptoms of palpitation or chest pain except for the feeling of near syncope on 1/30  In the ED, patient was afebrile, heart rate of 150, blood pressure 135/74 breathing on room air. EKG confirmed A. fib with RVR at the rate 131 bpm and QTC prolonged to 504 ms. Labs with potassium 3.5, lactic acid elevated 2 Urinalysis positive for small leukocytes, blood culture sent Patient was started on Cardizem drip Admitted to hospitalist service   Subjective: Patient was seen and examined this morning.  Pleasant middle-aged Caucasian female.  Propped up in bed.  Not in distress.  Not on supplemental oxygen.  Has mild cough.  Husband at bedside. She is remained on Cardizem drip and heart rhythm is still A. fib with a rate over 100.    Assessment/Plan: A. fib with RVR -New onset A. fib, presented with RVR up to 150/min -Probably triggered by flulike symptoms, dehydration for about a week. -TSH normal.  Pending echocardiogram. -Started on Cardizem drip in the ED.  Still in A. fib with heart rate over 100 mostly -CHA2DS2-VASc score 2 based on gender and  hypertension. -No prior history of significant bleeding -We will discuss with cardiology  Lactic acidosis -Mildly elevated WBC count with lactic acid mildly elevated to 2.  Probably due to dehydration.  Both WC count and lactic acid level improved subsequently.  Not on antibiotics. Recent Labs  Lab 02/08/21 1631 02/08/21 1707 02/08/21 2000 02/09/21 0654  WBC 12.6*  --   --  9.6  LATICACIDVEN  --  2.0* 1.1  --   PROCALCITON <0.10  --   --   --    Hypokalemia -Potassium low at 3.1 this morning.  Replacement ordered. Recent Labs  Lab 02/08/21 1631 02/09/21 0654  K 3.5 3.1*  MG 2.1   2.0  --    Essential hypertension -On HCTZ 25 mg daily at home.  Continue same.  Pyuria -Urinalysis with leukocytes.  Probably due to dehydration.  No complaint of dysuria, hematuria, urgency or frequency.  Not on antibiotics currently.  Mobility: Encourage ambulation Living condition: Lives at home Goals of care:   Code Status: Full Code  Nutritional status: Body mass index is 27.67 kg/m.      Diet:  Diet Order             Diet Heart Room service appropriate? Yes; Fluid consistency: Thin  Diet effective now                  DVT prophylaxis:  enoxaparin (LOVENOX) injection 40 mg Start: 02/08/21  [place] TED hose Start: 02/08/21 2120   Antimicrobials: None Fluid: None currently Consultants: Cardiology consulted. Family Communication: Husband at bedside  Status is: Observation  Continue in-hospital care because: Remains in A. fib Level of care: Progressive   Dispo: The patient is from: Home              Anticipated d/c is to: Home              Patient currently is not medically stable to d/c.   Difficult to place patient No     Infusions:   diltiazem (CARDIZEM) infusion 5 mg/hr (02/09/21 0935)    Scheduled Meds:  benzonatate  100 mg Oral TID   enoxaparin (LOVENOX) injection  40 mg Subcutaneous Q24H   hydrochlorothiazide  25 mg Oral Daily   norethindrone  1  tablet Oral Daily    PRN meds: acetaminophen **OR** acetaminophen, ondansetron **OR** ondansetron (ZOFRAN) IV   Antimicrobials: Anti-infectives (From admission, onward)    None       Objective: Vitals:   02/09/21 0730 02/09/21 0830  BP: 127/87 109/86  Pulse: 80 (!) 103  Resp: 17 20  Temp:    SpO2: 95% 99%    Intake/Output Summary (Last 24 hours) at 02/09/2021 1000 Last data filed at 02/09/2021 0529 Gross per 24 hour  Intake 2049.53 ml  Output --  Net 2049.53 ml   Filed Weights   02/08/21 1629  Weight: 85 kg   Weight change:  Body mass index is 27.67 kg/m.   Physical Exam: General exam: Pleasant, middle-aged Caucasian female.  Not in physical distress Skin: No rashes, lesions or ulcers. HEENT: Atraumatic, normocephalic, no obvious bleeding Lungs: Clear to auscultation bilaterally CVS: Tachycardic, A. fib, no murmur GI/Abd soft, nontender, nondistended, bowel sound present CNS: Alert, awake, oriented x3 Psychiatry: Mood appropriate Extremities: No pedal edema, no calf tenderness  Data Review: I have personally reviewed the laboratory data and studies available.  F/u labs ordered Unresulted Labs (From admission, onward)     Start     Ordered   02/09/21 0500  HIV Antibody (routine testing w rflx)  Once,   STAT        02/09/21 0500   02/08/21 2211  Urine Culture  Once,   STAT       Question:  Indication  Answer:  Sepsis   02/08/21 2210            Signed, Terrilee Croak, MD [facility] 02/09/2021

## 2021-02-10 ENCOUNTER — Observation Stay: Payer: BC Managed Care – PPO | Admitting: Anesthesiology

## 2021-02-10 ENCOUNTER — Encounter: Payer: Self-pay | Admitting: Cardiovascular Disease

## 2021-02-10 ENCOUNTER — Encounter
Admission: EM | Disposition: A | Discharge status: Home / Self Care | Payer: Self-pay | Source: Ambulatory Visit | Attending: Internal Medicine

## 2021-02-10 ENCOUNTER — Observation Stay (HOSPITAL_COMMUNITY)
Admit: 2021-02-10 | Discharge: 2021-02-10 | Disposition: A | Discharge status: Home / Self Care | Payer: BC Managed Care – PPO | Attending: Cardiovascular Disease | Admitting: Cardiovascular Disease

## 2021-02-10 DIAGNOSIS — Z803 Family history of malignant neoplasm of breast: Secondary | ICD-10-CM | POA: Diagnosis not present

## 2021-02-10 DIAGNOSIS — Z6830 Body mass index (BMI) 30.0-30.9, adult: Secondary | ICD-10-CM | POA: Diagnosis not present

## 2021-02-10 DIAGNOSIS — I517 Cardiomegaly: Secondary | ICD-10-CM

## 2021-02-10 DIAGNOSIS — Z20822 Contact with and (suspected) exposure to covid-19: Secondary | ICD-10-CM | POA: Diagnosis present

## 2021-02-10 DIAGNOSIS — I700 Atherosclerosis of aorta: Secondary | ICD-10-CM | POA: Diagnosis not present

## 2021-02-10 DIAGNOSIS — I10 Essential (primary) hypertension: Secondary | ICD-10-CM

## 2021-02-10 DIAGNOSIS — R0602 Shortness of breath: Secondary | ICD-10-CM

## 2021-02-10 DIAGNOSIS — E860 Dehydration: Secondary | ICD-10-CM | POA: Diagnosis present

## 2021-02-10 DIAGNOSIS — I340 Nonrheumatic mitral (valve) insufficiency: Secondary | ICD-10-CM | POA: Diagnosis not present

## 2021-02-10 DIAGNOSIS — E663 Overweight: Secondary | ICD-10-CM | POA: Diagnosis present

## 2021-02-10 DIAGNOSIS — Z28310 Unvaccinated for covid-19: Secondary | ICD-10-CM | POA: Diagnosis not present

## 2021-02-10 DIAGNOSIS — I429 Cardiomyopathy, unspecified: Secondary | ICD-10-CM | POA: Diagnosis present

## 2021-02-10 DIAGNOSIS — Z87891 Personal history of nicotine dependence: Secondary | ICD-10-CM | POA: Diagnosis not present

## 2021-02-10 DIAGNOSIS — I4891 Unspecified atrial fibrillation: Secondary | ICD-10-CM

## 2021-02-10 DIAGNOSIS — Z79899 Other long term (current) drug therapy: Secondary | ICD-10-CM | POA: Diagnosis not present

## 2021-02-10 DIAGNOSIS — E872 Acidosis, unspecified: Secondary | ICD-10-CM | POA: Diagnosis present

## 2021-02-10 DIAGNOSIS — I5021 Acute systolic (congestive) heart failure: Secondary | ICD-10-CM | POA: Diagnosis present

## 2021-02-10 DIAGNOSIS — Z8249 Family history of ischemic heart disease and other diseases of the circulatory system: Secondary | ICD-10-CM | POA: Diagnosis not present

## 2021-02-10 DIAGNOSIS — I110 Hypertensive heart disease with heart failure: Secondary | ICD-10-CM | POA: Diagnosis present

## 2021-02-10 DIAGNOSIS — L400 Psoriasis vulgaris: Secondary | ICD-10-CM | POA: Diagnosis present

## 2021-02-10 DIAGNOSIS — K529 Noninfective gastroenteritis and colitis, unspecified: Secondary | ICD-10-CM | POA: Diagnosis present

## 2021-02-10 DIAGNOSIS — J40 Bronchitis, not specified as acute or chronic: Secondary | ICD-10-CM | POA: Diagnosis present

## 2021-02-10 DIAGNOSIS — Z823 Family history of stroke: Secondary | ICD-10-CM | POA: Diagnosis not present

## 2021-02-10 HISTORY — PX: CARDIOVERSION: SHX1299

## 2021-02-10 HISTORY — PX: TEE WITHOUT CARDIOVERSION: SHX5443

## 2021-02-10 LAB — CULTURE, BLOOD (ROUTINE X 2)

## 2021-02-10 SURGERY — ECHOCARDIOGRAM, TRANSESOPHAGEAL
Anesthesia: General

## 2021-02-10 SURGERY — CARDIOVERSION
Anesthesia: General

## 2021-02-10 MED ORDER — BENZONATATE 100 MG PO CAPS: 100.0000 mg | ORAL_CAPSULE | Freq: Three times a day (TID) | ORAL | 0 refills | Status: DC

## 2021-02-10 MED ORDER — APIXABAN 5 MG PO TABS: 5.0000 mg | ORAL_TABLET | Freq: Two times a day (BID) | ORAL | 2 refills | Status: DC

## 2021-02-10 MED ORDER — PROPOFOL 10 MG/ML IV BOLUS
INTRAVENOUS | Status: DC | PRN
  Administered 2021-02-10: 50 mg via INTRAVENOUS
  Administered 2021-02-10 (×3): 20 mg via INTRAVENOUS
  Administered 2021-02-10: 50 mg via INTRAVENOUS
  Administered 2021-02-10 (×2): 20 mg via INTRAVENOUS

## 2021-02-10 MED ORDER — PHENYLEPHRINE HCL-NACL 20-0.9 MG/250ML-% IV SOLN
INTRAVENOUS | Status: AC
  Filled 2021-02-10: qty 250

## 2021-02-10 MED ORDER — METOPROLOL TARTRATE 25 MG PO TABS
25.0000 mg | ORAL_TABLET | Freq: Two times a day (BID) | ORAL | Status: DC
  Administered 2021-02-10: 25 mg via ORAL
  Filled 2021-02-10: qty 1

## 2021-02-10 MED ORDER — METOPROLOL TARTRATE 25 MG PO TABS: 25.0000 mg | ORAL_TABLET | Freq: Two times a day (BID) | ORAL | 2 refills | Status: DC

## 2021-02-10 MED ORDER — FUROSEMIDE 20 MG PO TABS
20.0000 mg | ORAL_TABLET | Freq: Every day | ORAL | 0 refills | Status: DC | PRN
Start: 2021-02-10 — End: 2021-03-21

## 2021-02-10 MED ORDER — LIDOCAINE VISCOUS HCL 2 % MT SOLN
OROMUCOSAL | Status: AC
  Filled 2021-02-10: qty 15

## 2021-02-10 MED ORDER — BUTAMBEN-TETRACAINE-BENZOCAINE 2-2-14 % EX AERO
INHALATION_SPRAY | CUTANEOUS | Status: AC
  Filled 2021-02-10: qty 5

## 2021-02-10 MED ORDER — FUROSEMIDE 20 MG PO TABS
20.0000 mg | ORAL_TABLET | Freq: Once | ORAL | Status: AC
Start: 2021-02-10 — End: 2021-02-10
  Administered 2021-02-10: 20 mg via ORAL
  Filled 2021-02-10: qty 1

## 2021-02-10 MED ORDER — PROPOFOL 500 MG/50ML IV EMUL
INTRAVENOUS | Status: AC
  Filled 2021-02-10: qty 50

## 2021-02-10 MED ORDER — POTASSIUM CHLORIDE CRYS ER 10 MEQ PO TBCR: 10.0000 meq | EXTENDED_RELEASE_TABLET | Freq: Every day | ORAL | 0 refills | Status: DC

## 2021-02-10 MED ORDER — PHENYLEPHRINE 40 MCG/ML (10ML) SYRINGE FOR IV PUSH (FOR BLOOD PRESSURE SUPPORT)
PREFILLED_SYRINGE | INTRAVENOUS | Status: DC | PRN
  Administered 2021-02-10 (×5): 80 ug via INTRAVENOUS

## 2021-02-10 NOTE — Progress Notes (Signed)
Transesophageal Echocardiogram :  Indication: atrial fibrillation Requesting/ordering  physician:   Procedure: Benzocaine spray x2 and 2 mls x 2 of viscous lidocaine were given orally to provide local anesthesia to the oropharynx. The patient was positioned supine on the left side, bite block provided. The patient was moderately sedated with the doses of versed and fentanyl as detailed below.  Using digital technique an omniplane probe was advanced into the distal esophagus without incident.   Moderate sedation:   See report in EPIC  for complete details: In brief, transgastric imaging revealed normal LV function with no RWMAs and no mural apical thrombus.  .  Estimated ejection fraction was 40-45%, global hypokinesis.  Right sided cardiac chambers were normal with no evidence of pulmonary hypertension. Moderately decreased RV function.  Imaging of the septum showed no ASD or VSD Bubble study was negative for shunt 2D and color flow confirmed no PFO  The LA was well visualized in orthogonal views.  There was no spontaneous contrast and no thrombus in the LA and LA appendage   The descending thoracic aorta had no  mural aortic debris with no evidence of aneurysmal dilation or disection  Cardioversion to follow   Julien Nordmann 02/10/2021 9:11 AM

## 2021-02-10 NOTE — Anesthesia Procedure Notes (Signed)
Date/Time: 02/10/2021 8:17 AM Performed by: Joanette Gula, Daune Colgate, CRNA Pre-anesthesia Checklist: Patient identified, Emergency Drugs available, Suction available, Patient being monitored and Timeout performed Patient Re-evaluated:Patient Re-evaluated prior to induction Oxygen Delivery Method: Nasal cannula Induction Type: IV induction

## 2021-02-10 NOTE — Progress Notes (Signed)
Progress Note  Patient Name: Courtney Walsh Date of Encounter: 02/10/2021  [facility] Cardiologist: Dr. Garen Lah  Subjective   Successful TEE and cardioversion this morning Normal sinus rhythm restored, no procedural complications Seen again up on the floor late morning, eating well, Little bit of shortness of breath at rest  Inpatient Medications    Scheduled Meds:  apixaban  5 mg Oral BID   benzonatate  100 mg Oral TID   butamben-tetracaine-benzocaine       lidocaine       metoprolol tartrate  25 mg Oral BID   norethindrone  1 tablet Oral Daily   Continuous Infusions:  PRN Meds: acetaminophen **OR** acetaminophen, ondansetron **OR** ondansetron (ZOFRAN) IV   Vital Signs    Vitals:   02/10/21 0845 02/10/21 0900 02/10/21 0921 02/10/21 1204  BP: 103/80 96/81 121/87 106/77  Pulse: 67 70 69 74  Resp:   16   Temp:   97.6 F (36.4 C) 97.7 F (36.5 C)  TempSrc:      SpO2: 95% 94% 98% 96%  Weight:      Height:        Intake/Output Summary (Last 24 hours) at 02/10/2021 1335 Last data filed at 02/10/2021 0815 Gross per 24 hour  Intake 400 ml  Output 300 ml  Net 100 ml   Last 3 Weights 02/09/2021 02/08/2021 02/08/2021  Weight (lbs) 204 lb 4.8 oz 187 lb 6.3 oz 201 lb 6.4 oz  Weight (kg) 92.67 kg 85 kg 91.354 kg      Telemetry    Normal sinus rhythm- Personally Reviewed  ECG     - Personally Reviewed  Physical Exam   GEN: No acute distress.   Neck: No JVD Cardiac: RRR, no murmurs, rubs, or gallops.  Respiratory: Clear to auscultation bilaterally. GI: Soft, nontender, non-distended  MS: No edema; No deformity. Neuro:  Nonfocal  Psych: Normal affect   Labs    High Sensitivity Troponin:   Recent Labs  Lab 02/08/21 1631  TROPONINIHS 7     Chemistry Recent Labs  Lab 02/08/21 1631 02/09/21 0654  NA 140 139  K 3.5 3.1*  CL 100 106  CO2 28 26  GLUCOSE 108* 92  BUN 10 8  CREATININE 0.73 0.64  CALCIUM 9.2 8.5*  MG 2.1   2.0  --   PROT 7.4   --   ALBUMIN 3.9  --   AST 27  --   ALT 31  --   ALKPHOS 94  --   BILITOT 1.0  --   GFRNONAA >60 >60  ANIONGAP 12 7    Lipids No results for input(s): CHOL, TRIG, HDL, LABVLDL, LDLCALC, CHOLHDL in the last 168 hours.  Hematology Recent Labs  Lab 02/08/21 1631 02/09/21 0654  WBC 12.6* 9.6  RBC 5.39* 4.99  HGB 15.8* 14.4  HCT 47.5* 42.9  MCV 88.1 86.0  MCH 29.3 28.9  MCHC 33.3 33.6  RDW 12.8 12.8  PLT 264 210   Thyroid  Recent Labs  Lab 02/08/21 1631  TSH 0.928    BNP Recent Labs  Lab 02/08/21 1631  BNP 245.9*    DDimer  Recent Labs  Lab 02/08/21 1631  DDIMER 0.46     Radiology    DG Chest 2 View  Result Date: 02/08/2021 CLINICAL DATA:  Atrial fibrillation. Shortness of breath and dizziness. EXAM: CHEST - 2 VIEW COMPARISON:  None. FINDINGS: The heart size and mediastinal contours are within normal limits. Normal pulmonary vascularity. No focal  consolidation, pleural effusion, or pneumothorax. Small calcified granuloma in the left lower lobe. No acute osseous abnormality. IMPRESSION: 1. No acute cardiopulmonary disease. Electronically Signed   By: Titus Dubin M.D.   On: 02/08/2021 17:24   ECHOCARDIOGRAM COMPLETE  Result Date: 02/09/2021    ECHOCARDIOGRAM REPORT   Patient Name:   Courtney Walsh Date of Exam: 02/09/2021 Medical Rec #:  [id number]        Height:       69.0 in Accession #:    [id number]       Weight:       187.4 lb Date of Birth:  01/28/1970        BSA:          2.010 m Patient Age:    51 years         BP:           109/86 mmHg Patient Gender: F                HR:           103 bpm. Exam Location:  [facility] Procedure: 2D Echo, Cardiac Doppler and Color Doppler Indications:     Atrial Fibrillation I48.91  History:         Patient has no prior history of Echocardiogram examinations.                  Risk Factors:Hypertension. Irregular heart beat.  Sonographer:     Sherrie Sport Referring Phys:  [id number] AMY N COX Diagnosing Phys: Kate Sable MD   Sonographer Comments: Suboptimal parasternal window. IMPRESSIONS  1. Left ventricular ejection fraction, by estimation, is 40 to 45%. The left ventricle has mildly decreased function. The left ventricle demonstrates global hypokinesis. The left ventricular internal cavity size was mildly dilated. Left ventricular diastolic parameters are indeterminate.  2. Right ventricular systolic function is normal. The right ventricular size is normal.  3. Left atrial size was mildly dilated.  4. The mitral valve is normal in structure. Mild mitral valve regurgitation.  5. The aortic valve is grossly normal. Aortic valve regurgitation is not visualized. FINDINGS  Left Ventricle: Left ventricular ejection fraction, by estimation, is 40 to 45%. The left ventricle has mildly decreased function. The left ventricle demonstrates global hypokinesis. The left ventricular internal cavity size was mildly dilated. There is  no left ventricular hypertrophy. Left ventricular diastolic parameters are indeterminate. Right Ventricle: The right ventricular size is normal. No increase in right ventricular wall thickness. Right ventricular systolic function is normal. Left Atrium: Left atrial size was mildly dilated. Right Atrium: Right atrial size was normal in size. Pericardium: There is no evidence of pericardial effusion. Mitral Valve: The mitral valve is normal in structure. Mild mitral valve regurgitation. Tricuspid Valve: The tricuspid valve is normal in structure. Tricuspid valve regurgitation is not demonstrated. Aortic Valve: The aortic valve is grossly normal. Aortic valve regurgitation is not visualized. Aortic valve mean gradient measures 2.0 mmHg. Aortic valve peak gradient measures 3.8 mmHg. Aortic valve area, by VTI measures 2.61 cm. Pulmonic Valve: The pulmonic valve was not well visualized. Pulmonic valve regurgitation is not visualized. Aorta: The aortic root is normal in size and structure. Venous: The inferior vena cava was  not well visualized. IAS/Shunts: No atrial level shunt detected by color flow Doppler.  LEFT VENTRICLE PLAX 2D LVIDd:         5.30 cm LVIDs:         4.30 cm LV PW:  1.10 cm LV IVS:        0.80 cm LVOT diam:     2.10 cm LV SV:         38 LV SV Index:   19 LVOT Area:     3.46 cm  RIGHT VENTRICLE RV Basal diam:  2.60 cm LEFT ATRIUM             Index        RIGHT ATRIUM           Index LA diam:        4.00 cm 1.99 cm/m   RA Area:     19.60 cm LA Vol (A2C):   94.4 ml 46.97 ml/m  RA Volume:   55.30 ml  27.52 ml/m LA Vol (A4C):   66.9 ml 33.29 ml/m LA Biplane Vol: 81.0 ml 40.31 ml/m  AORTIC VALVE                    PULMONIC VALVE AV Area (Vmax):    2.50 cm     PV Vmax:        0.46 m/s AV Area (Vmean):   2.22 cm     PV Vmean:       31.600 cm/s AV Area (VTI):     2.61 cm     PV VTI:         0.076 m AV Vmax:           96.85 cm/s   PV Peak grad:   0.9 mmHg AV Vmean:          69.550 cm/s  PV Mean grad:   0.0 mmHg AV VTI:            0.146 m      RVOT Peak grad: 2 mmHg AV Peak Grad:      3.8 mmHg AV Mean Grad:      2.0 mmHg LVOT Vmax:         69.80 cm/s LVOT Vmean:        44.500 cm/s LVOT VTI:          0.110 m LVOT/AV VTI ratio: 0.75  AORTA Ao Root diam: 3.57 cm MITRAL VALVE MV Area (PHT): 6.71 cm    SHUNTS MV Decel Time: 113 msec    Systemic VTI:  0.11 m MV E velocity: 79.70 cm/s  Systemic Diam: 2.10 cm                            Pulmonic VTI:  0.102 m Kate Sable MD Electronically signed by Kate Sable MD Signature Date/Time: 02/09/2021/2:35:59 PM    Final     Cardiac Studies   Echocardiogram Ejection fraction 40%, mild global hypokinesis  Patient Profile     Courtney Walsh is a 51 y.o. female with a hx of hypertension who is being seen 02/09/2021 for the evaluation of atrial fibrillation  Assessment & Plan    New onset atrial fibrillation with RVR -CHA2DS2-VASc score 3 (chf, htn, gender) --Successful TEE and cardioversion this morning, normal sinus rhythm restored -Continue Eliquis 5  mg twice daily -We will transition to metoprolol tartrate 25 twice daily Discontinue diltiazem infusion which was still running this morning -Low blood pressure, will hold on transition to metoprolol succinate until seen as outpatient   2.  Cardiomyopathy EF 40 to 45% -Possibly tachycardia induced Successful cardioversion, Unable to add ACE, ARB Entresto given low blood pressure Continue metoprolol tartrate  25 twice daily, outpatient transition to metoprolol succinate if blood pressure allows   3.  Hypertension -Metoprolol as above Hold HCTZ  Discharge instructions discussed  Total encounter time more than 50 minutes  Greater than 50% was spent in counseling and coordination of care with the patient   For questions or updates, please contact [place] Please consult [email or web address] for contact info under        Signed, Ida Rogue, MD  02/10/2021, 1:35 PM

## 2021-02-10 NOTE — Discharge Summary (Addendum)
Physician Discharge Summary  Courtney Walsh [id number] DOB: Dec 23, 1970 DOA: 02/08/2021  PCP: Venita Lick, NP  Admit date: 02/08/2021 Discharge date: 02/10/2021  Admitted From: Home Discharge disposition: Home   Code Status: Full Code   Discharge Diagnosis:   Principal Problem:   Atrial fibrillation with RVR ([place]) Active Problems:   Essential hypertension, benign   Irregular heartbeat   Psoriasis   Pyuria    Chief Complaint  Patient presents with   Tachycardia    A fib rvr, sob, dizziness     Brief narrative: Courtney Walsh is a 51 y.o. female with PMH significant for hypertension, psoriasis. Patient was sent to the ED from walk-in clinic on 1/31 for A. fib with RVR. Patient states that she started having flulike symptoms about a week ago with muscle ache, diarrhea, nausea, vomiting, cough and a fever up to 101.  Her symptoms gradually improved but she continued to have lightheadedness and dizziness for few days. On 1/30, she nearly passed out in the shower after which she made an appointment with PCP for the next day on 1/31. In the PCPs office, she was noted to have A. fib with RVR.  She did not have any symptoms of palpitation or chest pain except for the feeling of near syncope on 1/30  In the ED, patient was afebrile, heart rate of 150, blood pressure 135/74 breathing on room air. EKG confirmed A. fib with RVR at the rate 131 bpm and QTC prolonged to 504 ms. Labs with potassium 3.5, lactic acid elevated 2 Urinalysis positive for small leukocytes, blood culture sent Patient was started on Cardizem drip Admitted to hospitalist service  Subjective: Patient was seen and examined this morning.   Lying on bed.  Not in distress.  Underwent pleasant middle-aged Caucasian female.  Propped up in bed.  Not in distress.  Not on supplemental oxygen.  Has mild cough.  Husband at bedside. She is remained on Cardizem drip and heart rhythm is still A. fib with a rate over  100.    Hospital course: A. fib with RVR -New onset A. fib, presented with RVR up to 150/min -Probably triggered by flulike symptoms, dehydration for about a week. -TSH normal.   -Started on Cardizem drip in the ED.  -Cardiology consultation was obtained.  Patient underwent TEE cardioversion today.  Currently in sinus rhythm. -CHA2DS2-VASc score 3 based on gender, hypertension and CHF.  Started on Eliquis.  New onset acute systolic CHF Essential hypertension -Echo with EF low at 40 to 45%. -Per cardiology, it is probably related to tachycardia.  She has been started on Toprol. -Prior to admission, patient was on HCTZ. -We will discharge her home on metoprolol 25 mg twice daily and Lasix 20 mg daily as needed..  Blood pressure running on low side of normal.  Follow-up with cardiology as an outpatient.  If blood pressure tolerates, she would benefit from ACEI/ARB as an outpatient.  Lactic acidosis -Mildly elevated WBC count with lactic acid mildly elevated to 2.  Probably due to dehydration.  Both WBC count and lactic acid level improved subsequently.  Currently not on antibiotics. Recent Labs  Lab 02/08/21 1631 02/08/21 1707 02/08/21 2000 02/09/21 0654  WBC 12.6*  --   --  9.6  LATICACIDVEN  --  2.0* 1.1  --   PROCALCITON <0.10  --   --   --    Pyuria -Urinalysis with leukocytes.  Urine culture with 30,000 CFU per mL of E. coli.   -  No complaint of dysuria, hematuria, urgency or frequency.  Not on antibiotics currently.  Nonproductive cough -Patient had flulike symptoms for a week prior to presentation.  Most of her symptoms are gone but she continues to have persistent dry cough.  She has been started on [employer] with improvement.  Continue the same for next 1 week.   Mobility: Encourage ambulation Living condition: Lives at home Goals of care:   Code Status: Full Code  Nutritional status: Body mass index is 30.17 kg/m.      Discharge Medications:   Allergies as  of 02/10/2021   No Known Allergies      Medication List     STOP taking these medications    hydrochlorothiazide 25 MG tablet Commonly known as: HYDRODIURIL       TAKE these medications    apixaban 5 MG Tabs tablet Commonly known as: ELIQUIS Take 1 tablet (5 mg total) by mouth 2 (two) times daily.   benzonatate 100 MG capsule Commonly known as: TESSALON Take 1 capsule (100 mg total) by mouth 3 (three) times daily for 7 days.   furosemide 20 MG tablet Commonly known as: LASIX Take 1 tablet (20 mg total) by mouth daily as needed for up to 30 doses for fluid or edema.   metoprolol tartrate 25 MG tablet Commonly known as: LOPRESSOR Take 1 tablet (25 mg total) by mouth 2 (two) times daily.   norethindrone 0.35 MG tablet Commonly known as: MICRONOR TAKE 1 TABLET BY MOUTH EVERY DAY   potassium chloride 10 MEQ tablet Commonly known as: KLOR-CON M Take 1 tablet (10 mEq total) by mouth daily.        Wound care:     Discharge Instructions:   Discharge Instructions     Call MD for:  difficulty breathing, headache or visual disturbances   Complete by: As directed    Call MD for:  extreme fatigue   Complete by: As directed    Call MD for:  hives   Complete by: As directed    Call MD for:  persistant dizziness or light-headedness   Complete by: As directed    Call MD for:  persistant nausea and vomiting   Complete by: As directed    Call MD for:  severe uncontrolled pain   Complete by: As directed    Call MD for:  temperature >100.4   Complete by: As directed    Diet - low sodium heart healthy   Complete by: As directed    Discharge instructions   Complete by: As directed    Discharge instructions for CHF Check weight daily -preferably same time every day. Restrict fluid intake to 1200 ml daily Restrict salt intake to less than 2 g daily. Call MD if you have one of the following symptoms 1) 3 pound weight gain in 24 hours or 5 pounds in 1 week  2) swelling  in the hands, feet or stomach  3) progressive shortness of breath 4) if you have to sleep on extra pillows at night in order to breathe     General discharge instructions:  Follow with Primary MD Venita Lick, NP in 7 days   Get CBC/BMP checked in next visit within 1 week by PCP or SNF MD. (We routinely change or add medications that can affect your baseline labs and fluid status, therefore we recommend that you get the mentioned basic workup next visit with your PCP, your PCP may decide not to get them or  add new tests based on their clinical decision)  On your next visit with your PCP, please get your medicines reviewed and adjusted.  Please request your PCP  to go over all hospital tests, procedures, radiology results at the follow up, please get all Hospital records sent to your PCP by signing hospital release before you go home.  Activity: As tolerated with Full fall precautions use walker/cane & assistance as needed  Avoid using any recreational substances like cigarette, tobacco, alcohol, or non-prescribed drug.  If you experience worsening of your admission symptoms, develop shortness of breath, life threatening emergency, suicidal or homicidal thoughts you must seek medical attention immediately by calling 911 or calling your MD immediately  if symptoms less severe.  You must read complete instructions/literature along with all the possible adverse reactions/side effects for all the medicines you take and that have been prescribed to you. Take any new medicine only after you have completely understood and accepted all the possible adverse reactions/side effects.   Do not drive, operate heavy machinery, perform activities at heights, swimming or participation in water activities or provide baby sitting services if your were admitted for syncope or siezures until you have seen by Primary MD or a Neurologist and advised to do so again.  Do not drive when taking Pain  medications.  Do not take more than prescribed Pain, Sleep and Anxiety Medications  Wear Seat belts while driving.  Please note You were cared for by a hospitalist during your hospital stay. If you have any questions about your discharge medications or the care you received while you were in the hospital after you are discharged, you can call the unit and asked to speak with the hospitalist on call if the hospitalist that took care of you is not available. Once you are discharged, your primary care physician will handle any further medical issues. Please note that NO REFILLS for any discharge medications will be authorized once you are discharged, as it is imperative that you return to your primary care physician (or establish a relationship with a primary care physician if you do not have one) for your aftercare needs so that they can reassess your need for medications and monitor your lab values.   Increase activity slowly   Complete by: As directed        Follow ups:    Discharge Exam:   Vitals:   02/10/21 0845 02/10/21 0900 02/10/21 0921 02/10/21 1204  BP: 103/80 96/81 121/87 106/77  Pulse: 67 70 69 74  Resp:   16   Temp:   97.6 F (36.4 C) 97.7 F (36.5 C)  TempSrc:      SpO2: 95% 94% 98% 96%  Weight:      Height:        Body mass index is 30.17 kg/m.   General exam: Pleasant, middle-aged Caucasian female.  Not in physical distress Skin: No rashes, lesions or ulcers. HEENT: Atraumatic, normocephalic, no obvious bleeding Lungs: Clear to auscultation bilaterally CVS: Normal sinus rhythm GI/Abd soft, nontender, nondistended, bowel sound present CNS: Alert, awake, oriented x3 Psychiatry: Mood appropriate Extremities: No pedal edema, no calf tenderness  Time coordinating discharge: 35 minutes   The results of significant diagnostics from this hospitalization (including imaging, microbiology, ancillary and laboratory) are listed below for reference.    Procedures and  Diagnostic Studies:   No results found.   Labs:   Basic Metabolic Panel: Recent Labs  Lab 02/08/21 1631 02/09/21 0654  NA 140 139  K 3.5 3.1*  CL 100 106  CO2 28 26  GLUCOSE 108* 92  BUN 10 8  CREATININE 0.73 0.64  CALCIUM 9.2 8.5*  MG 2.1   2.0  --    GFR Estimated Creatinine Clearance: 99.7 mL/min (by C-G formula based on SCr of 0.64 mg/dL). Liver Function Tests: Recent Labs  Lab 02/08/21 1631  AST 27  ALT 31  ALKPHOS 94  BILITOT 1.0  PROT 7.4  ALBUMIN 3.9   No results for input(s): LIPASE, AMYLASE in the last 168 hours. No results for input(s): AMMONIA in the last 168 hours. Coagulation profile Recent Labs  Lab 02/08/21 2000 02/09/21 2058  INR 1.0 1.1    CBC: Recent Labs  Lab 02/08/21 1631 02/09/21 0654  WBC 12.6* 9.6  NEUTROABS 8.0*  --   HGB 15.8* 14.4  HCT 47.5* 42.9  MCV 88.1 86.0  PLT 264 210   Cardiac Enzymes: No results for input(s): CKTOTAL, CKMB, CKMBINDEX, TROPONINI in the last 168 hours. BNP: Invalid input(s): POCBNP CBG: No results for input(s): GLUCAP in the last 168 hours. D-Dimer Recent Labs    02/08/21 1631  DDIMER 0.46   Hgb A1c No results for input(s): HGBA1C in the last 72 hours. Lipid Profile No results for input(s): CHOL, HDL, LDLCALC, TRIG, CHOLHDL, LDLDIRECT in the last 72 hours. Thyroid function studies Recent Labs    02/08/21 1631  TSH 0.928   Anemia work up No results for input(s): VITAMINB12, FOLATE, FERRITIN, TIBC, IRON, RETICCTPCT in the last 72 hours. Microbiology Recent Results (from the past 240 hour(s))  Resp Panel by RT-PCR (Flu A&B, Covid) Nasopharyngeal Swab     Status: None   Collection Time: 02/08/21  4:31 PM   Specimen: Nasopharyngeal Swab; Nasopharyngeal(NP) swabs in vial transport medium  Result Value Ref Range Status   SARS Coronavirus 2 by RT PCR NEGATIVE NEGATIVE Final    Comment: (NOTE) SARS-CoV-2 target nucleic acids are NOT DETECTED.  The SARS-CoV-2 RNA is generally detectable  in upper respiratory specimens during the acute phase of infection. The lowest concentration of SARS-CoV-2 viral copies this assay can detect is 138 copies/mL. A negative result does not preclude SARS-Cov-2 infection and should not be used as the sole basis for treatment or other patient management decisions. A negative result may occur with  improper specimen collection/handling, submission of specimen other than nasopharyngeal swab, presence of viral mutation(s) within the areas targeted by this assay, and inadequate number of viral copies(<138 copies/mL). A negative result must be combined with clinical observations, patient history, and epidemiological information. The expected result is Negative.  Fact Sheet for Patients:  EntrepreneurPulse.com.au  Fact Sheet for Healthcare Providers:  IncredibleEmployment.be  This test is no t yet approved or cleared by the [place] FDA and  has been authorized for detection and/or diagnosis of SARS-CoV-2 by FDA under an Emergency Use Authorization (EUA). This EUA will remain  in effect (meaning this test can be used) for the duration of the COVID-19 declaration under Section 564(b)(1) of the Act, 21 U.S.C.section 360bbb-3(b)(1), unless the authorization is terminated  or revoked sooner.       Influenza A by PCR NEGATIVE NEGATIVE Final   Influenza B by PCR NEGATIVE NEGATIVE Final    Comment: (NOTE) The Xpert Xpress SARS-CoV-2/FLU/RSV plus assay is intended as an aid in the diagnosis of influenza from Nasopharyngeal swab specimens and should not be used as a sole basis for treatment. Nasal washings and aspirates are unacceptable for Xpert Xpress SARS-CoV-2/FLU/RSV testing.  Fact Sheet for Patients: EntrepreneurPulse.com.au  Fact Sheet for Healthcare Providers: IncredibleEmployment.be  This test is not yet approved or cleared by the [place] FDA and has  been authorized for detection and/or diagnosis of SARS-CoV-2 by FDA under an Emergency Use Authorization (EUA). This EUA will remain in effect (meaning this test can be used) for the duration of the COVID-19 declaration under Section 564(b)(1) of the Act, 21 U.S.C. section 360bbb-3(b)(1), unless the authorization is terminated or revoked.  Performed at [facility], [address] [place], [place] [address]   Blood Culture (routine x 2)     Status: None (Preliminary result)   Collection Time: 02/08/21  5:07 PM   Specimen: BLOOD  Result Value Ref Range Status   Specimen Description BLOOD BLOOD RIGHT HAND  Final   Special Requests BOTTLES DRAWN AEROBIC AND ANAEROBIC BCAV  Final   Culture   Final    NO GROWTH < 12 HOURS Performed at [facility], [address] [place], [place] [address]    Report Status PENDING  Incomplete  Urine Culture     Status: Abnormal (Preliminary result)   Collection Time: 02/08/21  5:07 PM   Specimen: Urine, Clean Catch  Result Value Ref Range Status   Specimen Description   Final    URINE, CLEAN CATCH Performed at [facility], [address] [place], [place] [address]    Special Requests   Final    NONE Performed at [facility], [address] [place], [place] [address]    Culture (A)  Final    30,000 COLONIES/mL STAPHYLOCOCCUS AUREUS SUSCEPTIBILITIES TO FOLLOW Performed at [facility], [place] [address], [place], [place] [address]    Report Status PENDING  Incomplete  Blood Culture (routine x 2)     Status: Abnormal   Collection Time: 02/08/21  5:12 PM   Specimen: BLOOD  Result Value Ref Range Status   Specimen Description   Final    BLOOD LEFT ANTECUBITAL Performed at [facility], [address] [place], [place] [address]    Special Requests   Final    BOTTLES DRAWN AEROBIC AND ANAEROBIC BCAV Performed at [facility], [address], [place], [place] [address]     Culture  Setup Time   Final    GRAM POSITIVE COCCI AEROBIC BOTTLE ONLY CRITICAL RESULT CALLED TO, READ BACK BY AND VERIFIED WITH: BRANDON BEERS PHARMD 1154 02/09/21 HNM    Culture (A)  Final    STAPHYLOCOCCUS CAPITIS THE SIGNIFICANCE OF ISOLATING THIS ORGANISM FROM A SINGLE SET OF BLOOD CULTURES WHEN MULTIPLE SETS ARE DRAWN IS UNCERTAIN. PLEASE NOTIFY THE MICROBIOLOGY DEPARTMENT WITHIN ONE WEEK IF SPECIATION AND SENSITIVITIES ARE REQUIRED. Performed at [facility], [place] [address] [place], [place] [address]    Report Status 02/10/2021 FINAL  Final  Blood Culture ID Panel (Reflexed)     Status: Abnormal   Collection Time: 02/08/21  5:12 PM  Result Value Ref Range Status   Enterococcus faecalis NOT DETECTED NOT DETECTED Final   Enterococcus Faecium NOT DETECTED NOT DETECTED Final   Listeria monocytogenes NOT DETECTED NOT DETECTED Final   Staphylococcus species DETECTED (A) NOT DETECTED Final    Comment: CRITICAL RESULT CALLED TO, READ BACK BY AND VERIFIED WITH: BRANDON BEERS PHARMD 1154 02/09/21 HNM    Staphylococcus aureus (BCID) NOT DETECTED NOT DETECTED Final   Staphylococcus epidermidis NOT DETECTED NOT DETECTED Final   Staphylococcus lugdunensis NOT DETECTED NOT DETECTED Final  Streptococcus species NOT DETECTED NOT DETECTED Final   Streptococcus agalactiae NOT DETECTED NOT DETECTED Final   Streptococcus pneumoniae NOT DETECTED NOT DETECTED Final   Streptococcus pyogenes NOT DETECTED NOT DETECTED Final   A.calcoaceticus-baumannii NOT DETECTED NOT DETECTED Final   Bacteroides fragilis NOT DETECTED NOT DETECTED Final   Enterobacterales NOT DETECTED NOT DETECTED Final   Enterobacter cloacae complex NOT DETECTED NOT DETECTED Final   Escherichia coli NOT DETECTED NOT DETECTED Final   Klebsiella aerogenes NOT DETECTED NOT DETECTED Final   Klebsiella oxytoca NOT DETECTED NOT DETECTED Final   Klebsiella pneumoniae NOT DETECTED NOT DETECTED Final   Proteus species NOT DETECTED NOT  DETECTED Final   Salmonella species NOT DETECTED NOT DETECTED Final   Serratia marcescens NOT DETECTED NOT DETECTED Final   Haemophilus influenzae NOT DETECTED NOT DETECTED Final   Neisseria meningitidis NOT DETECTED NOT DETECTED Final   Pseudomonas aeruginosa NOT DETECTED NOT DETECTED Final   Stenotrophomonas maltophilia NOT DETECTED NOT DETECTED Final   Candida albicans NOT DETECTED NOT DETECTED Final   Candida auris NOT DETECTED NOT DETECTED Final   Candida glabrata NOT DETECTED NOT DETECTED Final   Candida krusei NOT DETECTED NOT DETECTED Final   Candida parapsilosis NOT DETECTED NOT DETECTED Final   Candida tropicalis NOT DETECTED NOT DETECTED Final   Cryptococcus neoformans/gattii NOT DETECTED NOT DETECTED Final    Comment: Performed at [facility], [address] [place],  [address]     Signed: Terrilee Croak  [facility] 02/10/2021, 2:18 PM

## 2021-02-10 NOTE — Progress Notes (Signed)
*  PRELIMINARY RESULTS* Echocardiogram 2D Echocardiogram has been performed.  Courtney Walsh 02/10/2021, 8:21 AM

## 2021-02-10 NOTE — TOC CM/SW Note (Signed)
Patient has orders to discharge home today. Chart reviewed. PCP is Aura Dials, NP. On room air. No wounds. Discharging on new Eliquis. Per RN, patient already has coupons in the room. No TOC needs identified. CSW signing off.  Charlynn Court, CSW [phone]

## 2021-02-10 NOTE — Anesthesia Postprocedure Evaluation (Signed)
Anesthesia Post Note  Patient: Courtney Walsh  Procedure(s) Performed: TRANSESOPHAGEAL ECHOCARDIOGRAM (TEE) CARDIOVERSION  Patient location during evaluation: PACU Anesthesia Type: General Level of consciousness: awake and alert Pain management: pain level controlled Vital Signs Assessment: post-procedure vital signs reviewed and stable Respiratory status: spontaneous breathing, nonlabored ventilation, respiratory function stable and patient connected to nasal cannula oxygen Cardiovascular status: blood pressure returned to baseline and stable Postop Assessment: no apparent nausea or vomiting Anesthetic complications: no   No notable events documented.   Last Vitals:  Vitals:   02/10/21 0900 02/10/21 0921  BP: 96/81 121/87  Pulse: 70 69  Resp:  16  Temp:  36.4 C  SpO2: 94% 98%    Last Pain:  Vitals:   02/10/21 0921  TempSrc:   PainSc: 0-No pain                 Yevette Edwards

## 2021-02-10 NOTE — Anesthesia Preprocedure Evaluation (Signed)
Anesthesia Evaluation  Patient identified by MRN, date of birth, ID band Patient awake    Reviewed: Allergy & Precautions, H&P , NPO status , Patient's Chart, lab work & pertinent test results, reviewed documented beta blocker date and time   Airway Mallampati: II   Neck ROM: full    Dental  (+) Poor Dentition   Pulmonary neg pulmonary ROS, former smoker,    Pulmonary exam normal        Cardiovascular Exercise Tolerance: Poor hypertension, On Medications negative cardio ROS  Atrial Fibrillation  Rhythm:regular Rate:Normal     Neuro/Psych negative neurological ROS  negative psych ROS   GI/Hepatic negative GI ROS, Neg liver ROS,   Endo/Other  negative endocrine ROS  Renal/GU negative Renal ROS  negative genitourinary   Musculoskeletal   Abdominal   Peds  Hematology negative hematology ROS (+)   Anesthesia Other Findings Past Medical History: No date: Hypertension No date: Irregular heart beat No date: Overweight October 2015: Thyroid cyst     Comment:  Left lobe-6 mm. Past Surgical History: No date: NO PAST SURGERIES BMI    Body Mass Index: 30.17 kg/m     Reproductive/Obstetrics negative OB ROS                             Anesthesia Physical Anesthesia Plan  ASA: 4  Anesthesia Plan: General   Post-op Pain Management:    Induction:   PONV Risk Score and Plan:   Airway Management Planned:   Additional Equipment:   Intra-op Plan:   Post-operative Plan:   Informed Consent: I have reviewed the patients History and Physical, chart, labs and discussed the procedure including the risks, benefits and alternatives for the proposed anesthesia with the patient or authorized representative who has indicated his/her understanding and acceptance.     Dental Advisory Given  Plan Discussed with: CRNA  Anesthesia Plan Comments:         Anesthesia Quick Evaluation

## 2021-02-10 NOTE — Progress Notes (Signed)
*  PRELIMINARY RESULTS* Echocardiogram Echocardiogram Transesophageal has been performed.  Sherrie Sport 02/10/2021, 8:22 AM

## 2021-02-10 NOTE — Transfer of Care (Signed)
Immediate Anesthesia Transfer of Care Note  Patient: Courtney Walsh  Procedure(s) Performed: TRANSESOPHAGEAL ECHOCARDIOGRAM (TEE) CARDIOVERSION  Patient Location: Endoscopy Unit  Anesthesia Type:General  Level of Consciousness: awake, alert  and oriented  Airway & Oxygen Therapy: Patient Spontanous Breathing  Post-op Assessment: Report given to RN and Post -op Vital signs reviewed and stable  Post vital signs: Reviewed and stable  Last Vitals:  Vitals Value Taken Time  BP 94/69 02/10/21 0821  Temp    Pulse 68 02/10/21 0823  Resp 14 02/10/21 0823  SpO2 95 % 02/10/21 0823  Vitals shown include unvalidated device data.  Last Pain:  Vitals:   02/10/21 0738  TempSrc: Oral  PainSc: 0-No pain      Patients Stated Pain Goal: 6 (02/10/21 0000)  Complications: No notable events documented.

## 2021-02-10 NOTE — CV Procedure (Signed)
Cardioversion procedure note ?For atrial fibrillation, persistent. ? ?Procedure Details: ? ?Consent: Risks of procedure as well as the alternatives and risks of each were explained to the (patient/caregiver).  Consent for procedure obtained. ? ?Time Out: Verified patient identification, verified procedure, site/side was marked, verified correct patient position, special equipment/implants available, medications/allergies/relevent history reviewed, required imaging and test results available.  Performed ? ?Patient placed on cardiac monitor, pulse oximetry, supplemental oxygen as necessary.   ?Sedation given: propofol IV, Dr.  ?Pacer pads placed anterior and posterior chest. ? ? ?Cardioverted 1 time(s).   ?Cardioverted at  150 J. Synchronized biphasic ?Converted to NSR ? ? ?Evaluation: ?Findings: Post procedure EKG shows: NSR ?Complications: None ?Patient did tolerate procedure well. ? ?Time Spent Directly with the Patient: ? ?45 minutes  ? ?Tim Iyonnah Ferrante, M.D., Ph.D. ? ?

## 2021-02-11 LAB — URINE CULTURE: Culture: 30000 — AB

## 2021-02-13 LAB — CULTURE, BLOOD (ROUTINE X 2): Culture: NO GROWTH

## 2021-02-17 ENCOUNTER — Ambulatory Visit (INDEPENDENT_AMBULATORY_CARE_PROVIDER_SITE_OTHER): Payer: BC Managed Care – PPO | Admitting: Internal Medicine

## 2021-02-17 ENCOUNTER — Encounter: Payer: Self-pay | Admitting: Internal Medicine

## 2021-02-17 ENCOUNTER — Other Ambulatory Visit: Payer: Self-pay

## 2021-02-17 VITALS — BP 135/84 | HR 59 | Temp 98.8°F | Ht 68.9 in | Wt 206.8 lb

## 2021-02-17 DIAGNOSIS — I4891 Unspecified atrial fibrillation: Secondary | ICD-10-CM | POA: Diagnosis not present

## 2021-02-17 NOTE — Progress Notes (Signed)
BP 135/84    Pulse (!) 59    Temp 98.8 F (37.1 C) (Oral)    Ht 5' 8.9" (1.75 m)    Wt 206 lb 12.8 oz (93.8 kg)    SpO2 99%    BMI 30.63 kg/m    Subjective:    Patient ID: Courtney Walsh, female    DOB: Feb 15, 1970, 51 y.o.   MRN: [id number] Chief Complaint  Patient presents with   abnormal heart rate    Patient here for f/u after being sent to ER.     Chief Complaint  Patient presents with   abnormal heart rate    Patient here for f/u after being sent to ER.     HPI: Courtney Walsh is a 51 y.o. female  Pt is here for a hospital fu.  Pt was diagnosed with afib with RVR s/p iv cardizem drip, had TEE with cardioversion day 2 of hospitalization, was in hosp x 2 days.  Feels better now., was dizzy per her verbal record back to work now.  Heart Problem This is a new (is on metoporolol 25 mg bid) problem. The current episode started 1 to 4 weeks ago (PR @ home 69 - 89). The problem has been resolved. Pertinent negatives include no abdominal pain, anorexia, arthralgias, change in bowel habit, chest pain, chills, congestion, coughing, diaphoresis, fatigue, fever, headaches, joint swelling, myalgias, nausea, neck pain, numbness, rash, sore throat, swollen glands, urinary symptoms, vertigo, visual change, vomiting or weakness.   Chief Complaint  Patient presents with   abnormal heart rate    Patient here for f/u after being sent to ER.     Relevant past medical, surgical, family and social history reviewed and updated as indicated. Interim medical history since our last visit reviewed. Allergies and medications reviewed and updated.  Review of Systems  Constitutional:  Negative for chills, diaphoresis, fatigue and fever.  HENT:  Negative for congestion and sore throat.   Respiratory:  Negative for cough.   Cardiovascular:  Negative for chest pain.  Gastrointestinal:  Negative for abdominal pain, anorexia, change in bowel habit, nausea and vomiting.  Musculoskeletal:   Negative for arthralgias, joint swelling, myalgias and neck pain.  Skin:  Negative for rash.  Neurological:  Negative for vertigo, weakness, numbness and headaches.   Per HPI unless specifically indicated above     Objective:    BP 135/84    Pulse (!) 59    Temp 98.8 F (37.1 C) (Oral)    Ht 5' 8.9" (1.75 m)    Wt 206 lb 12.8 oz (93.8 kg)    SpO2 99%    BMI 30.63 kg/m   Wt Readings from Last 3 Encounters:  03/03/21 203 lb (92.1 kg)  02/17/21 206 lb 12.8 oz (93.8 kg)  02/09/21 204 lb 4.8 oz (92.7 kg)    Physical Exam Vitals and nursing note reviewed.  Constitutional:      General: She is not in acute distress.    Appearance: Normal appearance. She is not ill-appearing or diaphoretic.  Eyes:     Conjunctiva/sclera: Conjunctivae normal.  Cardiovascular:     Rate and Rhythm: Normal rate and regular rhythm.  Pulmonary:     Breath sounds: No rhonchi.  Skin:    General: Skin is warm and dry.     Coloration: Skin is not jaundiced.     Findings: No erythema.  Neurological:     Mental Status: She is alert.     Cranial Nerves: No cranial  nerve deficit.     Sensory: No sensory deficit.    Results for orders placed or performed in visit on 02/17/21  Comprehensive metabolic panel  Result Value Ref Range   Glucose 76 70 - 99 mg/dL   BUN 7 6 - 24 mg/dL   Creatinine, Ser 0.52 (L) 0.57 - 1.00 mg/dL   eGFR 112 >59 mL/min/1.73   BUN/Creatinine Ratio 13 9 - 23   Sodium 139 134 - 144 mmol/L   Potassium 4.3 3.5 - 5.2 mmol/L   Chloride 102 96 - 106 mmol/L   CO2 21 20 - 29 mmol/L   Calcium 9.5 8.7 - 10.2 mg/dL   Total Protein 6.9 6.0 - 8.5 g/dL   Albumin 4.0 3.8 - 4.9 g/dL   Globulin, Total 2.9 1.5 - 4.5 g/dL   Albumin/Globulin Ratio 1.4 1.2 - 2.2   Bilirubin Total 0.5 0.0 - 1.2 mg/dL   Alkaline Phosphatase 103 44 - 121 IU/L   AST 19 0 - 40 IU/L   ALT 23 0 - 32 IU/L  TSH  Result Value Ref Range   TSH 1.210 0.450 - 4.500 uIU/mL  T4, free  Result Value Ref Range   Free T4 1.12  0.82 - 1.77 ng/dL        Current Outpatient Medications:    apixaban (ELIQUIS) 5 MG TABS tablet, Take 1 tablet (5 mg total) by mouth 2 (two) times daily., Disp: 60 tablet, Rfl: 2   furosemide (LASIX) 20 MG tablet, Take 1 tablet (20 mg total) by mouth daily as needed for up to 30 doses for fluid or edema., Disp: 30 tablet, Rfl: 0   metoprolol tartrate (LOPRESSOR) 25 MG tablet, Take 1 tablet (25 mg total) by mouth 2 (two) times daily., Disp: 60 tablet, Rfl: 2   norethindrone (MICRONOR) 0.35 MG tablet, Take 1 tablet (0.35 mg total) by mouth daily., Disp: 84 tablet, Rfl: 4    Assessment & Plan:  Afib: Is metoprolol xl 25 bid lasix - stopped taking.  Eliquis bid. Seen dr. Rockey Situ  CHF EF at 52 - 72 %    Problem List Items Addressed This Visit   None Visit Diagnoses     Atrial fibrillation, unspecified type [facility])    -  Primary   Relevant Orders   Ambulatory referral to Cardiology   Comprehensive metabolic panel (Completed)   TSH (Completed)   T4, free (Completed)        Orders Placed This Encounter  Procedures   Comprehensive metabolic panel   TSH   T4, free   Ambulatory referral to Cardiology     No orders of the defined types were placed in this encounter.    Follow up plan: Return in about 4 weeks (around 03/17/2021).

## 2021-02-18 LAB — T4, FREE: Free T4: 1.12 ng/dL (ref 0.82–1.77)

## 2021-02-18 LAB — TSH: TSH: 1.21 u[IU]/mL (ref 0.450–4.500)

## 2021-02-18 LAB — COMPREHENSIVE METABOLIC PANEL
ALT: 23 IU/L (ref 0–32)
AST: 19 IU/L (ref 0–40)
Albumin/Globulin Ratio: 1.4 (ref 1.2–2.2)
Albumin: 4 g/dL (ref 3.8–4.9)
Alkaline Phosphatase: 103 IU/L (ref 44–121)
BUN/Creatinine Ratio: 13 (ref 9–23)
BUN: 7 mg/dL (ref 6–24)
Bilirubin Total: 0.5 mg/dL (ref 0.0–1.2)
CO2: 21 mmol/L (ref 20–29)
Calcium: 9.5 mg/dL (ref 8.7–10.2)
Chloride: 102 mmol/L (ref 96–106)
Creatinine, Ser: 0.52 mg/dL — ABNORMAL LOW (ref 0.57–1.00)
Globulin, Total: 2.9 g/dL (ref 1.5–4.5)
Glucose: 76 mg/dL (ref 70–99)
Potassium: 4.3 mmol/L (ref 3.5–5.2)
Sodium: 139 mmol/L (ref 134–144)
Total Protein: 6.9 g/dL (ref 6.0–8.5)
eGFR: 112 mL/min/{1.73_m2} (ref >59)

## 2021-03-03 ENCOUNTER — Ambulatory Visit (INDEPENDENT_AMBULATORY_CARE_PROVIDER_SITE_OTHER): Payer: BC Managed Care – PPO | Admitting: Advanced Practice Midwife

## 2021-03-03 ENCOUNTER — Other Ambulatory Visit: Payer: Self-pay

## 2021-03-03 ENCOUNTER — Encounter: Payer: Self-pay | Admitting: Advanced Practice Midwife

## 2021-03-03 VITALS — BP 120/80 | Ht 69.0 in | Wt 203.0 lb

## 2021-03-03 DIAGNOSIS — Z0000 Encounter for general adult medical examination without abnormal findings: Secondary | ICD-10-CM | POA: Diagnosis not present

## 2021-03-03 DIAGNOSIS — Z3041 Encounter for surveillance of contraceptive pills: Secondary | ICD-10-CM

## 2021-03-03 DIAGNOSIS — Z1239 Encounter for other screening for malignant neoplasm of breast: Secondary | ICD-10-CM

## 2021-03-03 MED ORDER — NORETHINDRONE 0.35 MG PO TABS: 1.0000 | ORAL_TABLET | Freq: Every day | ORAL | 4 refills | Status: DC

## 2021-03-03 NOTE — Progress Notes (Signed)
Gynecology Annual Exam  PCP: Venita Lick, NP  Chief Complaint:  Chief Complaint  Patient presents with   Annual Exam    [facility] refill     History of Present Illness:Patient is a 51 y.o. [id number] presents for annual exam. The patient has no gyn complaints today. She had an episode of A-Fib a few weeks ago and is feeling well since TEE procedure.   LMP: Patient's last menstrual period was 02/24/2021.  Average Interval: regular with rare missed cycle, 28 days Duration of flow:  7-10  days Heavy Menses: no Clots: no Intermenstrual Bleeding: no Postcoital Bleeding: no Dysmenorrhea: no  The patient is sexually active. She denies dyspareunia.  The patient does perform self breast exams.  There is no notable family history of breast or ovarian cancer in her family.  The patient wears seatbelts: yes.   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 She admits improved diet and hydration since her episode of A-fib, however, she travels a lot for her job and tends to have fast food. She usually has 4-6 hours of sleep .    The patient denies current symptoms of depression.     Review of Systems: Review of Systems  Constitutional:  Negative for chills and fever.  HENT:  Negative for congestion, ear discharge, ear pain, hearing loss, sinus pain and sore throat.   Eyes:  Negative for blurred vision and double vision.  Respiratory:  Negative for cough, shortness of breath and wheezing.   Cardiovascular:  Negative for chest pain, palpitations and leg swelling.  Gastrointestinal:  Negative for abdominal pain, blood in stool, constipation, diarrhea, heartburn, melena, nausea and vomiting.  Genitourinary:  Negative for dysuria, flank pain, frequency, hematuria and urgency.  Musculoskeletal:  Negative for back pain, joint pain and myalgias.  Skin:  Negative for itching and rash.  Neurological:  Negative for dizziness, tingling, tremors, sensory  change, speech change, focal weakness, seizures, loss of consciousness, weakness and headaches.  Endo/Heme/Allergies:  Negative for environmental allergies. Does not bruise/bleed easily.  Psychiatric/Behavioral:  Negative for depression, hallucinations, memory loss, substance abuse and suicidal ideas. The patient is not nervous/anxious and does not have insomnia.    Past Medical History:  Patient Active Problem List   Diagnosis Date Noted   Atrial fibrillation with RVR ([place]) 02/08/2021   Pyuria 02/08/2021   Psoriasis 03/05/2020   Irregular heartbeat 10/12/2015   Overweight (BMI 25.0-29.9) 11/14/2014   Essential hypertension, benign 11/09/2014    Past Surgical History:  Past Surgical History:  Procedure Laterality Date   CARDIOVERSION N/A 02/10/2021   Procedure: CARDIOVERSION;  Surgeon: Minna Merritts, MD;  Location: [facility] ORS;  Service: Cardiovascular;  Laterality: N/A;   NO PAST SURGERIES     TEE WITHOUT CARDIOVERSION N/A 02/10/2021   Procedure: TRANSESOPHAGEAL ECHOCARDIOGRAM (TEE);  Surgeon: Minna Merritts, MD;  Location: [facility] ORS;  Service: Cardiovascular;  Laterality: N/A;    Gynecologic History:  Patient's last menstrual period was 02/24/2021. Last Pap: 2019 Results were:  no abnormalities  Last mammogram: 2017 Results were: BI-RAD I per patient report  Obstetric History: [id number]  Family History:  Family History  Problem Relation Age of Onset   Hypertension Mother    Hypertension Father    Stroke Father    Heart disease Maternal Grandfather    Heart disease Paternal Grandmother    Hypertension Sister    Breast cancer Paternal Aunt 24  contact   Cancer Neg Hx    Diabetes Neg Hx    COPD Neg Hx     Social History:  Social History   Socioeconomic History   Marital status: Married    Spouse name: Not on file   Number of children: Not on file   Years of education: Not on file   Highest education level: Not on file  Occupational History   Not on file   Tobacco Use   Smoking status: Former   Smokeless tobacco: Never  Vaping Use   Vaping Use: Never used  Substance and Sexual Activity   Alcohol use: Not Currently   Drug use: Never   Sexual activity: Yes    Partners: Male    Birth control/protection: Pill  Other Topics Concern   Not on file  Social History Narrative   Not on file   Social Determinants of Health   Financial Resource Strain: Not on file  Food Insecurity: Not on file  Transportation Needs: Not on file  Physical Activity: Not on file  Stress: Not on file  Social Connections: Not on file  Intimate Partner Violence: Not on file    Allergies:  No Known Allergies  Medications: Prior to Admission medications   Medication Sig Start Date End Date Taking? Authorizing Provider  apixaban (ELIQUIS) 5 MG TABS tablet Take 1 tablet (5 mg total) by mouth 2 (two) times daily. 02/10/21 05/11/21 Yes Dahal, Melina SchoolsBinaya, MD  furosemide (LASIX) 20 MG tablet Take 1 tablet (20 mg total) by mouth daily as needed for up to 30 doses for fluid or edema. 02/10/21  Yes Dahal, Melina SchoolsBinaya, MD  metoprolol tartrate (LOPRESSOR) 25 MG tablet Take 1 tablet (25 mg total) by mouth 2 (two) times daily. 02/10/21 05/11/21 Yes Dahal, Melina SchoolsBinaya, MD  norethindrone (MICRONOR) 0.35 MG tablet Take 1 tablet (0.35 mg total) by mouth daily. 03/03/21   Tresea MallGledhill, Kahlin Mark, CNM    Physical Exam Vitals: Blood pressure 120/80, height 5\' 9"  (1.753 m), weight 203 lb (92.1 kg), last menstrual period 02/24/2021.  General: NAD HEENT: normocephalic, anicteric Thyroid: no enlargement, no palpable nodules Pulmonary: No increased work of breathing, CTAB Cardiovascular: RRR, distal pulses 2+ Breast: Breast symmetrical, no tenderness, no palpable nodules or masses, no skin or nipple retraction present, no nipple discharge.  No axillary or supraclavicular lymphadenopathy. Abdomen: NABS, soft, non-tender, non-distended.  Umbilicus without lesions.  No hepatomegaly, splenomegaly or masses palpable.  No evidence of hernia  Genitourinary: deferred for no concerns/PAP interval Extremities: no edema, erythema, or tenderness Neurologic: Grossly intact Psychiatric: mood appropriate, affect full    Assessment: 51 y.o. [id number] routine annual exam  Plan: Problem List Items Addressed This Visit   None Visit Diagnoses     Well woman exam without gynecological exam    -  Primary   Relevant Medications   norethindrone (MICRONOR) 0.35 MG tablet   Breast screening       Relevant Orders   MM 3D SCREEN BREAST BILATERAL   Encounter for surveillance of contraceptive pills       Relevant Medications   norethindrone (MICRONOR) 0.35 MG tablet       1) Mammogram - recommend yearly screening mammogram.  Mammogram Was ordered today  2) STI screening  was offered and declined  3) ASCCP guidelines and rationale discussed.  Patient opts for every 5 years screening interval  4) Osteoporosis  - per USPTF routine screening DEXA at age 53  Consider FDA-approved medical therapies in postmenopausal women and men aged  43 years and older, based on the following: a) A hip or vertebral (clinical or morphometric) fracture b) T-score ? -2.5 at the femoral neck or spine after appropriate evaluation to exclude secondary causes C) Low bone mass (T-score between -1.0 and -2.5 at the femoral neck or spine) and a 10-year probability of a hip fracture ? 3% or a 10-year probability of a major osteoporosis-related fracture ? 20% based on the [place]-adapted WHO algorithm   5) Routine healthcare maintenance including cholesterol, diabetes screening discussed managed by PCP  6) Colonoscopy: patient declines screening at this time.  Screening recommended starting at age 53 for average risk individuals, age 22 for individuals deemed at increased risk (including African Americans) and recommended to continue until age 75.  For patient age 32-85 individualized approach is recommended.  Gold standard screening is via  colonoscopy, Cologuard screening is an acceptable alternative for patient unwilling or unable to undergo colonoscopy.  "Colorectal cancer screening for average?risk adults: 2018 guideline update from the American Cancer Society"CA: A Cancer Journal for Clinicians: Jun 07, 2016   7) Increase healthy lifestyle; diet, exercise, sleep  8) Return in about 1 year (around 03/03/2022) for annual established gyn with PAP smear.   Christean Leaf, CNM [facility] 03/03/21, 10:08 AM

## 2021-03-03 NOTE — Patient Instructions (Signed)
American Heart Association (AHA) Exercise Recommendation ° °Being physically active is important to prevent heart disease and stroke, the nation’s No. 1and No. 5killers. To improve overall cardiovascular health, we suggest at least 150 minutes per week of moderate exercise or 75 minutes per week of vigorous exercise (or a combination of moderate and vigorous activity). Thirty minutes a day, five times a week is an easy goal to remember. You will also experience benefits even if you divide your time into two or three segments of 10 to 15 minutes per day. ° °For people who would benefit from lowering their blood pressure or cholesterol, we recommend 40 minutes of aerobic exercise of moderate to vigorous intensity three to four times a week to lower the risk for heart attack and stroke. ° °Physical activity is anything that makes you move your body °and burn calories. ° °This includes things like climbing stairs or playing sports. Aerobic exercises benefit your heart, and include walking, jogging, swimming or biking. Strength and stretching exercises are best for overall stamina and flexibility.  °The simplest, positive change you can make to effectively improve your heart health is to start walking. It's enjoyable, free, easy, social and great exercise. A walking program is flexible and boasts high success rates because people can stick with it. It's easy for walking to become a regular and satisfying part of life. ° ° For Overall Cardiovascular Health: °At least 30 minutes of moderate-intensity aerobic activity at least 5 days per week for a total of 150 ° °OR ° °At least 25 minutes of vigorous aerobic activity at least 3 days per week for a total of 75 minutes; or a combination of moderate- and vigorous-intensity aerobic activity ° °AND ° °Moderate- to high-intensity muscle-strengthening activity at least 2 days per week for additional health benefits. ° °For Lowering Blood Pressure and Cholesterol °An average 40  minutes of moderate- to vigorous-intensity aerobic activity 3 or 4 times per week ° °What if I can’t make it to the time goal? °Something is always better than nothing! °And everyone has to start somewhere. Even if you've been sedentary for years, today is the day you can begin to make healthy changes in your life. If you don't think you'll make it for 30 or 40 minutes, set a reachable goal for today. You can work up toward your overall goal by increasing your time as you get stronger. Don't let all-or-nothing thinking rob you of doing what you can every day.  °Source:http://www.heart.org ° ° °Mediterranean Diet °A Mediterranean diet refers to food and lifestyle choices that are based on the traditions of countries located on the [place]. It focuses on eating more fruits, vegetables, whole grains, beans, nuts, seeds, and heart-healthy fats, and eating less dairy, meat, eggs, and processed foods with added sugar, salt, and fat. This way of eating has been shown to help prevent certain conditions and improve outcomes for people who have chronic diseases, like kidney disease and heart disease. °What are tips for following this plan? °Reading food labels °Check the serving size of packaged foods. For foods such as rice and pasta, the serving size refers to the amount of cooked product, not dry. °Check the total fat in packaged foods. Avoid foods that have saturated fat or trans fats. °Check the ingredient list for added sugars, such as corn syrup. °Shopping ° °Buy a variety of foods that offer a balanced diet, including: °Fresh fruits and vegetables (produce). °Grains, beans, nuts, and seeds. Some of these may   be available in unpackaged forms or large amounts (in bulk). °Fresh seafood. °Poultry and eggs. °Low-fat dairy products. °Buy whole ingredients instead of prepackaged foods. °Buy fresh fruits and vegetables in-season from local farmers markets. °Buy plain frozen fruits and vegetables. °If you do not have  access to quality fresh seafood, buy precooked frozen shrimp or canned fish, such as tuna, salmon, or sardines. °Stock your pantry so you always have certain foods on hand, such as olive oil, canned tuna, canned tomatoes, rice, pasta, and beans. °Cooking °Cook foods with extra-virgin olive oil instead of using butter or other vegetable oils. °Have meat as a side dish, and have vegetables or grains as your main dish. This means having meat in small portions or adding small amounts of meat to foods like pasta or stew. °Use beans or vegetables instead of meat in common dishes like chili or lasagna. °Experiment with different cooking methods. Try roasting, broiling, steaming, and sautéing vegetables. °Add frozen vegetables to soups, stews, pasta, or rice. °Add nuts or seeds for added healthy fats and plant protein at each meal. You can add these to yogurt, salads, or vegetable dishes. °Marinate fish or vegetables using olive oil, lemon juice, garlic, and fresh herbs. °Meal planning °Plan to eat one vegetarian meal one day each week. Try to work up to two vegetarian meals, if possible. °Eat seafood two or more times a week. °Have healthy snacks readily available, such as: °Vegetable sticks with hummus. °Greek yogurt. °Fruit and nut trail mix. °Eat balanced meals throughout the week. This includes: °Fruit: 2-3 servings a day. °Vegetables: 4-5 servings a day. °Low-fat dairy: 2 servings a day. °Fish, poultry, or lean meat: 1 serving a day. °Beans and legumes: 2 or more servings a week. °Nuts and seeds: 1-2 servings a day. °Whole grains: 6-8 servings a day. °Extra-virgin olive oil: 3-4 servings a day. °Limit red meat and sweets to only a few servings a month. °Lifestyle ° °Cook and eat meals together with your family, when possible. °Drink enough fluid to keep your urine pale yellow. °Be physically active every day. This includes: °Aerobic exercise like running or swimming. °Leisure activities like gardening, walking, or  housework. °Get 7-8 hours of sleep each night. °If recommended by your health care provider, drink red wine in moderation. This means 1 glass a day for nonpregnant women and 2 glasses a day for men. A glass of wine equals 5 oz (150 mL). °What foods should I eat? °Fruits °Apples. Apricots. Avocado. Berries. Bananas. Cherries. Dates. Figs. Grapes. Lemons. Melon. Oranges. Peaches. Plums. Pomegranate. °Vegetables °Artichokes. Beets. Broccoli. Cabbage. Carrots. Eggplant. Green beans. Chard. Kale. Spinach. Onions. Leeks. Peas. Squash. Tomatoes. Peppers. Radishes. °Grains °Whole-grain pasta. Brown rice. Bulgur wheat. Polenta. Couscous. Whole-wheat bread. Oatmeal. Quinoa. °Meats and other proteins °Beans. Almonds. Sunflower seeds. Pine nuts. Peanuts. Cod. Salmon. Scallops. Shrimp. Tuna. Tilapia. Clams. Oysters. Eggs. Poultry without skin. °Dairy °Low-fat milk. Cheese. Greek yogurt. °Fats and oils °Extra-virgin olive oil. Avocado oil. Grapeseed oil. °Beverages °Water. Red wine. Herbal tea. °Sweets and desserts °Greek yogurt with honey. Baked apples. Poached pears. Trail mix. °Seasonings and condiments °Basil. Cilantro. Coriander. Cumin. Mint. Parsley. Sage. Rosemary. Tarragon. Garlic. Oregano. Thyme. Pepper. Balsamic vinegar. Tahini. Hummus. Tomato sauce. Olives. Mushrooms. °The items listed above may not be a complete list of foods and beverages you can eat. Contact a dietitian for more information. °What foods should I limit? °This is a list of foods that should be eaten rarely or only on special occasions. °Fruits °Fruit canned in   syrup. °Vegetables °Deep-fried potatoes (french fries). °Grains °Prepackaged pasta or rice dishes. Prepackaged cereal with added sugar. Prepackaged snacks with added sugar. °Meats and other proteins °Beef. Pork. Lamb. Poultry with skin. Hot dogs. Bacon. °Dairy °Ice cream. Sour cream. Whole milk. °Fats and oils °Butter. Canola oil. Vegetable oil. Beef fat (tallow). Lard. °Beverages °Juice.  Sugar-sweetened soft drinks. Beer. Liquor and spirits. °Sweets and desserts °Cookies. Cakes. Pies. Candy. °Seasonings and condiments °Mayonnaise. Pre-made sauces and marinades. °The items listed above may not be a complete list of foods and beverages you should limit. Contact a dietitian for more information. °Summary °The Mediterranean diet includes both food and lifestyle choices. °Eat a variety of fresh fruits and vegetables, beans, nuts, seeds, and whole grains. °Limit the amount of red meat and sweets that you eat. °If recommended by your health care provider, drink red wine in moderation. This means 1 glass a day for nonpregnant women and 2 glasses a day for men. A glass of wine equals 5 oz (150 mL). °This information is not intended to replace advice given to you by your health care provider. Make sure you discuss any questions you have with your health care provider. °Document Revised: 01/31/2019 Document Reviewed: 11/28/2018 °Elsevier Patient Education © 2022 [employer] °Health Maintenance, Female °Adopting a healthy lifestyle and getting preventive care are important in promoting health and wellness. Ask your health care provider about: °The right schedule for you to have regular tests and exams. °Things you can do on your own to prevent diseases and keep yourself healthy. °What should I know about diet, weight, and exercise? °Eat a healthy diet ° °Eat a diet that includes plenty of vegetables, fruits, low-fat dairy products, and lean protein. °Do not eat a lot of foods that are high in solid fats, added sugars, or sodium. °Maintain a healthy weight °Body mass index (BMI) is used to identify weight problems. It estimates body fat based on height and weight. Your health care provider can help determine your BMI and help you achieve or maintain a healthy weight. °Get regular exercise °Get regular exercise. This is one of the most important things you can do for your health. Most adults should: °Exercise  for at least 150 minutes each week. The exercise should increase your heart rate and make you sweat (moderate-intensity exercise). °Do strengthening exercises at least twice a week. This is in addition to the moderate-intensity exercise. °Spend less time sitting. Even light physical activity can be beneficial. °Watch cholesterol and blood lipids °Have your blood tested for lipids and cholesterol at 53 years of age, then have this test every 5 years. °Have your cholesterol levels checked more often if: °Your lipid or cholesterol levels are high. °You are older than 53 years of age. °You are at high risk for heart disease. °What should I know about cancer screening? °Depending on your health history and family history, you may need to have cancer screening at various ages. This may include screening for: °Breast cancer. °Cervical cancer. °Colorectal cancer. °Skin cancer. °Lung cancer. °What should I know about heart disease, diabetes, and high blood pressure? °Blood pressure and heart disease °High blood pressure causes heart disease and increases the risk of stroke. This is more likely to develop in people who have high blood pressure readings or are overweight. °Have your blood pressure checked: °Every 3-5 years if you are 18-39 years of age. °Every year if you are 40 years old or older. °Diabetes °Have regular diabetes screenings. This checks your fasting   blood sugar level. Have the screening done: °Once every three years after age 40 if you are at a normal weight and have a low risk for diabetes. °More often and at a younger age if you are overweight or have a high risk for diabetes. °What should I know about preventing infection? °Hepatitis B °If you have a higher risk for hepatitis B, you should be screened for this virus. Talk with your health care provider to find out if you are at risk for hepatitis B infection. °Hepatitis C °Testing is recommended for: °Everyone born from 1945 through 1965. °Anyone with  known risk factors for hepatitis C. °Sexually transmitted infections (STIs) °Get screened for STIs, including gonorrhea and chlamydia, if: °You are sexually active and are younger than 53 years of age. °You are older than 53 years of age and your health care provider tells you that you are at risk for this type of infection. °Your sexual activity has changed since you were last screened, and you are at increased risk for chlamydia or gonorrhea. Ask your health care provider if you are at risk. °Ask your health care provider about whether you are at high risk for HIV. Your health care provider may recommend a prescription medicine to help prevent HIV infection. If you choose to take medicine to prevent HIV, you should first get tested for HIV. You should then be tested every 3 months for as long as you are taking the medicine. °Pregnancy °If you are about to stop having your period (premenopausal) and you may become pregnant, seek counseling before you get pregnant. °Take 400 to 800 micrograms (mcg) of folic acid every day if you become pregnant. °Ask for birth control (contraception) if you want to prevent pregnancy. °Osteoporosis and menopause °Osteoporosis is a disease in which the bones lose minerals and strength with aging. This can result in bone fractures. If you are 65 years old or older, or if you are at risk for osteoporosis and fractures, ask your health care provider if you should: °Be screened for bone loss. °Take a calcium or vitamin D supplement to lower your risk of fractures. °Be given hormone replacement therapy (HRT) to treat symptoms of menopause. °Follow these instructions at home: °Alcohol use °Do not drink alcohol if: °Your health care provider tells you not to drink. °You are pregnant, may be pregnant, or are planning to become pregnant. °If you drink alcohol: °Limit how much you have to: °0-1 drink a day. °Know how much alcohol is in your drink. In the [place] one drink equals one 12 oz bottle  of beer (355 mL), one 5 oz glass of wine (148 mL), or one 1½ oz glass of hard liquor (44 mL). °Lifestyle °Do not use any products that contain nicotine or tobacco. These products include cigarettes, chewing tobacco, and vaping devices, such as e-cigarettes. If you need help quitting, ask your health care provider. °Do not use street drugs. °Do not share needles. °Ask your health care provider for help if you need support or information about quitting drugs. °General instructions °Schedule regular health, dental, and eye exams. °Stay current with your vaccines. °Tell your health care provider if: °You often feel depressed. °You have ever been abused or do not feel safe at home. °Summary °Adopting a healthy lifestyle and getting preventive care are important in promoting health and wellness. °Follow your health care provider's instructions about healthy diet, exercising, and getting tested or screened for diseases. °Follow your health care provider's instructions on monitoring   your cholesterol and blood pressure. °This information is not intended to replace advice given to you by your health care provider. Make sure you discuss any questions you have with your health care provider. °Document Revised: 05/17/2020 Document Reviewed: 05/17/2020 °Elsevier Patient Education © 2022 [employer] ° °

## 2021-03-09 DIAGNOSIS — I4891 Unspecified atrial fibrillation: Secondary | ICD-10-CM | POA: Insufficient documentation

## 2021-03-09 NOTE — Addendum Note (Signed)
Addended by: Loura Pardon on: 03/09/2021 12:50 PM   Modules accepted: Level of Service

## 2021-03-19 DIAGNOSIS — I509 Heart failure, unspecified: Secondary | ICD-10-CM | POA: Insufficient documentation

## 2021-03-21 ENCOUNTER — Other Ambulatory Visit: Payer: Self-pay

## 2021-03-21 ENCOUNTER — Encounter: Payer: Self-pay | Admitting: Nurse Practitioner

## 2021-03-21 ENCOUNTER — Ambulatory Visit (INDEPENDENT_AMBULATORY_CARE_PROVIDER_SITE_OTHER): Payer: BC Managed Care – PPO | Admitting: Nurse Practitioner

## 2021-03-21 VITALS — BP 118/66 | HR 73 | Temp 98.1°F | Ht 69.0 in | Wt 204.0 lb

## 2021-03-21 DIAGNOSIS — I4891 Unspecified atrial fibrillation: Secondary | ICD-10-CM

## 2021-03-21 DIAGNOSIS — E6609 Other obesity due to excess calories: Secondary | ICD-10-CM | POA: Diagnosis not present

## 2021-03-21 DIAGNOSIS — Z6830 Body mass index (BMI) 30.0-30.9, adult: Secondary | ICD-10-CM

## 2021-03-21 DIAGNOSIS — I10 Essential (primary) hypertension: Secondary | ICD-10-CM

## 2021-03-21 DIAGNOSIS — I5021 Acute systolic (congestive) heart failure: Secondary | ICD-10-CM | POA: Diagnosis not present

## 2021-03-21 NOTE — Patient Instructions (Signed)
Atrial Fibrillation °Atrial fibrillation is a type of heartbeat that is irregular or fast. If you have this condition, your heart beats without any order. This makes it hard for your heart to pump blood in a normal way. °Atrial fibrillation may come and go, or it may become a long-lasting problem. If this condition is not treated, it can put you at higher risk for stroke, heart failure, and other heart problems. °What are the causes? °This condition may be caused by diseases that damage the heart. They include: °High blood pressure. °Heart failure. °Heart valve disease. °Heart surgery. °Other causes include: °Diabetes. °Thyroid disease. °Being overweight. °Kidney disease. °Sometimes the cause is not known. °What increases the risk? °You are more likely to develop this condition if: °You are older. °You smoke. °You exercise often and very hard. °You have a family history of this condition. °You are a man. °You use drugs. °You drink a lot of alcohol. °You have lung conditions, such as emphysema, pneumonia, or COPD. °You have sleep apnea. °What are the signs or symptoms? °Common symptoms of this condition include: °A feeling that your heart is beating very fast. °Chest pain or discomfort. °Feeling short of breath. °Suddenly feeling light-headed or weak. °Getting tired easily during activity. °Fainting. °Sweating. °In some cases, there are no symptoms. °How is this treated? °Treatment for this condition depends on underlying conditions and how you feel when you have atrial fibrillation. They include: °Medicines to: °Prevent blood clots. °Treat heart rate or heart rhythm problems. °Using devices, such as a pacemaker, to correct heart rhythm problems. °Doing surgery to remove the part of the heart that sends bad signals. °Closing an area where clots can form in the heart (left atrial appendage). °In some cases, your doctor will treat other underlying conditions. °Follow these instructions at home: °Medicines °Take  over-the-counter and prescription medicines only as told by your doctor. °Do not take any new medicines without first talking to your doctor. °If you are taking blood thinners: °Talk with your doctor before you take any medicines that have aspirin or NSAIDs, such as ibuprofen, in them. °Take your medicine exactly as told by your doctor. Take it at the same time each day. °Avoid activities that could hurt or bruise you. Follow instructions about how to prevent falls. °Wear a bracelet that says you are taking blood thinners. Or, carry a card that lists what medicines you take. °Lifestyle °  °Do not use any products that have nicotine or tobacco in them. These include cigarettes, e-cigarettes, and chewing tobacco. If you need help quitting, ask your doctor. °Eat heart-healthy foods. Talk with your doctor about the right eating plan for you. °Exercise regularly as told by your doctor. °Do not drink alcohol. °Lose weight if you are overweight. °Do not use drugs, including cannabis. °General instructions °If you have a condition that causes breathing to stop for a short period of time (apnea), treat it as told by your doctor. °Keep a healthy weight. Do not use diet pills unless your doctor says they are safe for you. Diet pills may make heart problems worse. °Keep all follow-up visits as told by your doctor. This is important. °Contact a doctor if: °You notice a change in the speed, rhythm, or strength of your heartbeat. °You are taking a blood-thinning medicine and you get more bruising. °You get tired more easily when you move or exercise. °You have a sudden change in weight. °Get help right away if: ° °You have pain in your chest or   your belly (abdomen). °You have trouble breathing. °You have side effects of blood thinners, such as blood in your vomit, poop (stool), or pee (urine), or bleeding that cannot stop. °You have any signs of a stroke. "BE FAST" is an easy way to remember the main warning signs: °B - Balance.  Signs are dizziness, sudden trouble walking, or loss of balance. °E - Eyes. Signs are trouble seeing or a change in how you see. °F - Face. Signs are sudden weakness or loss of feeling in the face, or the face or eyelid drooping on one side. °A - Arms. Signs are weakness or loss of feeling in an arm. This happens suddenly and usually on one side of the body. °S - Speech. Signs are sudden trouble speaking, slurred speech, or trouble understanding what people say. °T - Time. Time to call emergency services. Write down what time symptoms started. °You have other signs of a stroke, such as: °A sudden, very bad headache with no known cause. °Feeling like you may vomit (nausea). °Vomiting. °A seizure. °These symptoms may be an emergency. Do not wait to see if the symptoms will go away. Get medical help right away. Call your local emergency services (911 in the [place] Do not drive yourself to the hospital. °Summary °Atrial fibrillation is a type of heartbeat that is irregular or fast. °You are at higher risk of this condition if you smoke, are older, have diabetes, or are overweight. °Follow your doctor's instructions about medicines, diet, exercise, and follow-up visits. °Get help right away if you have signs or symptoms of a stroke. °Get help right away if you cannot catch your breath, or you have chest pain or discomfort. °This information is not intended to replace advice given to you by your health care provider. Make sure you discuss any questions you have with your health care provider. °Document Revised: 06/19/2018 Document Reviewed: 06/19/2018 °Elsevier Patient Education © 2022 [employer]. ° °

## 2021-03-21 NOTE — Assessment & Plan Note (Signed)
Chronic, stable at this time with BP at goal.  Recommend she monitor BP at least a few mornings a week at home and document.  DASH diet at home.  Continue current medication regimen and adjust as needed.  Labs next visit.  Return in 6 months. ? ?

## 2021-03-21 NOTE — Assessment & Plan Note (Signed)
Diagnosed 02/08/21, has follow-up upcoming with cardiology.  At this time rate controlled.  Continue collaboration with cardiology and current medication regimen.  She denies symptoms at this time. ?

## 2021-03-21 NOTE — Progress Notes (Signed)
? ?BP 118/66 (BP Location: Left Arm, Patient Position: Sitting, Cuff Size: Normal)   Pulse 73   Temp 98.1 ?F (36.7 ?C) (Oral)   Ht 5' 9"  (1.753 m)   Wt 204 lb (92.5 kg)   LMP 02/24/2021   SpO2 97%   BMI 30.13 kg/m?   ? ?Subjective:  ? ? Patient ID: Courtney Walsh, female    DOB: April 20, 1970, 51 y.o.   MRN: [id number] ? ?HPI: ?Courtney Walsh is a 51 y.o. female ? ?Chief Complaint  ?Patient presents with  ? Atrial Fibrillation  ?  Patient is here for a follow up on A-fib. Patient denies having any concerns at today's visit.   ? Congestive Heart Failure  ? ?ATRIAL FIBRILLATION ?Diagnosed on 02/08/21 -- TEE 02/10/21 -- LVEF 40-45%.  Continues on Metoprolol and Eliquis.  Is to se cardiology on 04/04/21.  Did cardioversion in hospital on 02/10/21 ?Atrial fibrillation status: controlled ?Satisfied with current treatment: yes  ?Medication side effects:  no ?Medication compliance: good compliance ?Etiology of atrial fibrillation: had flu at time ?Palpitations:  no ?Chest pain:  no ?Dyspnea on exertion:  no ?Orthopnea:  no ?Syncope:  no ?Edema:  no ?Ventricular rate control: B-blocker ?Anti-coagulation: long acting  ? ?Relevant past medical, surgical, family and social history reviewed and updated as indicated. Interim medical history since our last visit reviewed. ?Allergies and medications reviewed and updated. ? ?Review of Systems  ?Constitutional:  Negative for activity change, appetite change, diaphoresis, fatigue and fever.  ?Respiratory:  Negative for cough, chest tightness and shortness of breath.   ?Cardiovascular:  Negative for chest pain, palpitations and leg swelling.  ?Gastrointestinal: Negative.   ?Neurological: Negative.   ?Psychiatric/Behavioral: Negative.    ? ?Per HPI unless specifically indicated above ? ?   ?Objective:  ?  ?BP 118/66 (BP Location: Left Arm, Patient Position: Sitting, Cuff Size: Normal)   Pulse 73   Temp 98.1 ?F (36.7 ?C) (Oral)   Ht 5' 9"  (1.753 m)   Wt 204 lb (92.5 kg)   LMP  02/24/2021   SpO2 97%   BMI 30.13 kg/m?   ?Wt Readings from Last 3 Encounters:  ?03/21/21 204 lb (92.5 kg)  ?03/03/21 203 lb (92.1 kg)  ?02/17/21 206 lb 12.8 oz (93.8 kg)  ?  ?Physical Exam ?Vitals and nursing note reviewed.  ?Constitutional:   ?   General: She is awake. She is not in acute distress. ?   Appearance: She is well-developed and well-groomed. She is obese. She is not ill-appearing or toxic-appearing.  ?HENT:  ?   Head: Normocephalic.  ?   Right Ear: Hearing normal.  ?   Left Ear: Hearing normal.  ?Eyes:  ?   General: Lids are normal.     ?   Right eye: No discharge.     ?   Left eye: No discharge.  ?   Conjunctiva/sclera: Conjunctivae normal.  ?   Pupils: Pupils are equal, round, and reactive to light.  ?Neck:  ?   Thyroid: No thyromegaly.  ?   Vascular: No carotid bruit.  ?Cardiovascular:  ?   Rate and Rhythm: Normal rate. Rhythm irregularly irregular.  ?   Heart sounds: Normal heart sounds. No murmur heard. ?  No gallop.  ?Pulmonary:  ?   Effort: Pulmonary effort is normal. No accessory muscle usage or respiratory distress.  ?   Breath sounds: Normal breath sounds.  ?Abdominal:  ?   General: Bowel sounds are normal.  ?   Palpations: Abdomen  is soft. There is no hepatomegaly or splenomegaly.  ?Musculoskeletal:  ?   Cervical back: Normal range of motion and neck supple.  ?   Right lower leg: No edema.  ?   Left lower leg: No edema.  ?Lymphadenopathy:  ?   Cervical: No cervical adenopathy.  ?Skin: ?   General: Skin is warm and dry.  ?Neurological:  ?   Mental Status: She is alert and oriented to person, place, and time.  ?Psychiatric:     ?   Attention and Perception: Attention normal.     ?   Mood and Affect: Mood normal.     ?   Speech: Speech normal.     ?   Behavior: Behavior normal. Behavior is cooperative.     ?   Thought Content: Thought content normal.  ? ?Results for orders placed or performed in visit on 02/17/21  ?Comprehensive metabolic panel  ?Result Value Ref Range  ? Glucose 76 70 - 99  mg/dL  ? BUN 7 6 - 24 mg/dL  ? Creatinine, Ser 0.52 (L) 0.57 - 1.00 mg/dL  ? eGFR 112 >59 mL/min/1.73  ? BUN/Creatinine Ratio 13 9 - 23  ? Sodium 139 134 - 144 mmol/L  ? Potassium 4.3 3.5 - 5.2 mmol/L  ? Chloride 102 96 - 106 mmol/L  ? CO2 21 20 - 29 mmol/L  ? Calcium 9.5 8.7 - 10.2 mg/dL  ? Total Protein 6.9 6.0 - 8.5 g/dL  ? Albumin 4.0 3.8 - 4.9 g/dL  ? Globulin, Total 2.9 1.5 - 4.5 g/dL  ? Albumin/Globulin Ratio 1.4 1.2 - 2.2  ? Bilirubin Total 0.5 0.0 - 1.2 mg/dL  ? Alkaline Phosphatase 103 44 - 121 IU/L  ? AST 19 0 - 40 IU/L  ? ALT 23 0 - 32 IU/L  ?TSH  ?Result Value Ref Range  ? TSH 1.210 0.450 - 4.500 uIU/mL  ?T4, free  ?Result Value Ref Range  ? Free T4 1.12 0.82 - 1.77 ng/dL  ? ?   ?Assessment & Plan:  ? ?Problem List Items Addressed This Visit   ? ?  ? Cardiovascular and Mediastinum  ? Acute HF (heart failure) ([place])  ?  In presence of new onset a-fib in hospital, suspect will be improved on repeat echo.  Discussed with patient. At this time continue current medication regimen and collaboration with cardiology. ?  ?  ? Atrial fibrillation with RVR ([place]) - Primary  ?  Diagnosed 02/08/21, has follow-up upcoming with cardiology.  At this time rate controlled.  Continue collaboration with cardiology and current medication regimen.  She denies symptoms at this time. ?  ?  ? Essential hypertension, benign  ?  Chronic, stable at this time with BP at goal.  Recommend she monitor BP at least a few mornings a week at home and document.  DASH diet at home.  Continue current medication regimen and adjust as needed.  Labs next visit.  Return in 6 months. ? ?  ?  ?  ? Other  ? Obesity  ?  BMI 30.13.  Recommended eating smaller high protein, low fat meals more frequently and exercising 30 mins a day 5 times a week with a goal of 10-15lb weight loss in the next 3 months. Patient voiced their understanding and motivation to adhere to these recommendations. ? ?  ?  ?  ? ?Follow up plan: ?Return in about 6 months (around  09/21/2021) for Annual physical. ? ? ? ? ? ?

## 2021-03-21 NOTE — Assessment & Plan Note (Signed)
BMI 30.13.  Recommended eating smaller high protein, low fat meals more frequently and exercising 30 mins a day 5 times a week with a goal of 10-15lb weight loss in the next 3 months. Patient voiced their understanding and motivation to adhere to these recommendations. ° °

## 2021-03-21 NOTE — Assessment & Plan Note (Signed)
In presence of new onset a-fib in hospital, suspect will be improved on repeat echo.  Discussed with patient. At this time continue current medication regimen and collaboration with cardiology. ?

## 2021-04-03 DIAGNOSIS — I420 Dilated cardiomyopathy: Secondary | ICD-10-CM | POA: Insufficient documentation

## 2021-04-03 NOTE — Progress Notes (Signed)
Cardiology Office Note ? ?Date:  04/04/2021  ? ?ID:  Courtney Walsh, DOB Jul 20, 1970, MRN [id number] ? ?PCP:  Courtney Skiff, NP  ? ?Chief Complaint  ?Patient presents with  ? [facility] ER; A-Fib  ?  Patient c/o shortness of breath and dizziness.Medications reviewed by the patient verbally.   ? ? ?HPI:  ?[name] is a 51 year old woman with past medical history of ?atrial fibrillation ?-CHA2DS2-VASc score 3 (chf, htn, gender) ?In the hospital January 2023 for atrial fibrillation treated with diltiazem infusion ?Requiring TEE and cardioversion, on Eliquis 5 twice daily ?Cardiomyopathy EF 40 to 45% ?Unable to add ACE, ARB Entresto given low blood pressure at this time ?Who presents for follow-up of her atrial fibrillation ? ?Having sx of vertigo,  ?Symptom started yesterday ?Worse when she is lying flat in bed or moving her head ? ?Having SOB, fatigue , worse with stairs ?ABD stomach feeling ?Has not been taking Lasix ?Reports that she does eat out frequently ? ?No PND orthopnea ?No chest pain ? ?EKG personally reviewed by myself on todays visit ?Normal sinus rhythm rate of 72 bpm frequent PVCs ? ?Hospital records reviewed ?Transesophageal echo February 10, 2021 ? 1. Left ventricular ejection fraction, by estimation, is 40 to 45%. The  ?left ventricle has mildly decreased function. The left ventricle has no  ?regional wall motion abnormalities.  ? 2. Right ventricular systolic function is normal. The right ventricular  ?size is normal.  ? 3. Left atrial size was moderately dilated. No left atrial/left atrial  ?appendage thrombus was detected.  ? 4. The mitral valve is normal in structure. Mild mitral valve  ?regurgitation. ? ? ?PMH:   has a past medical history of Afib (HCC), Hypertension, Irregular heart beat, Overweight, and Thyroid cyst (10/2013). ? ?PSH:    ?Past Surgical History:  ?Procedure Laterality Date  ? CARDIOVERSION N/A 02/10/2021  ? Procedure: CARDIOVERSION;  Surgeon: Antonieta Iba, MD;   Location: [facility] ORS;  Service: Cardiovascular;  Laterality: N/A;  ? NO PAST SURGERIES    ? TEE WITHOUT CARDIOVERSION N/A 02/10/2021  ? Procedure: TRANSESOPHAGEAL ECHOCARDIOGRAM (TEE);  Surgeon: Antonieta Iba, MD;  Location: [facility] ORS;  Service: Cardiovascular;  Laterality: N/A;  ? ? ?Current Outpatient Medications  ?Medication Sig Dispense Refill  ? apixaban (ELIQUIS) 5 MG TABS tablet Take 1 tablet (5 mg total) by mouth 2 (two) times daily. 60 tablet 2  ? metoprolol tartrate (LOPRESSOR) 25 MG tablet Take 1 tablet (25 mg total) by mouth 2 (two) times daily. 60 tablet 2  ? norethindrone (MICRONOR) 0.35 MG tablet Take 1 tablet (0.35 mg total) by mouth daily. 84 tablet 4  ? ?No current facility-administered medications for this visit.  ? ? ? ?Allergies:   Patient has no known allergies.  ? ?Social History:  The patient  reports that she has quit smoking. She has never used smokeless tobacco. She reports that she does not currently use alcohol. She reports that she does not use drugs.  ? ?Family History:   family history includes Breast cancer (age of onset: 54) in her paternal aunt; Heart disease in her maternal grandfather and paternal grandmother; Hypertension in her father, mother, and sister; Stroke in her father.  ? ? ?Review of Systems: ?Review of Systems  ?Constitutional: Negative.   ?HENT: Negative.    ?Respiratory: Negative.    ?Cardiovascular: Negative.   ?Gastrointestinal: Negative.   ?Musculoskeletal: Negative.   ?Neurological:  Positive for dizziness.  ?Psychiatric/Behavioral: Negative.    ?All other  systems reviewed and are negative. ? ? ?PHYSICAL EXAM: ?VS:  BP (!) 140/94 (BP Location: Left Arm, Patient Position: Sitting, Cuff Size: Normal)   Pulse 72   Ht 5\' 9"  (1.753 m)   Wt 201 lb 8 oz (91.4 kg)   SpO2 99%   BMI 29.76 kg/m?  , BMI Body mass index is 29.76 kg/m?. ?GEN: Well nourished, well developed, in no acute distress ?HEENT: normal ?Neck: no JVD, carotid bruits, or masses ?Cardiac: RRR; no  murmurs, rubs, or gallops,no edema  ?Respiratory:  clear to auscultation bilaterally, normal work of breathing ?GI: soft, nontender, nondistended, + BS ?MS: no deformity or atrophy ?Skin: warm and dry, no rash ?Neuro:  Strength and sensation are intact ?Psych: euthymic mood, full affect ? ? ?Recent Labs: ?02/08/2021: B Natriuretic Peptide 245.9; Magnesium 2.0; Magnesium 2.1 ?02/09/2021: Hemoglobin 14.4; Platelets 210 ?02/17/2021: ALT 23; BUN 7; Creatinine, Ser 0.52; Potassium 4.3; Sodium 139; TSH 1.210  ? ? ?Lipid Panel ?Lab Results  ?Component Value Date  ? CHOL 179 01/23/2017  ? HDL 43 01/23/2017  ? LDLCALC 84 01/23/2017  ? TRIG 258 (H) 01/23/2017  ? ?  ? ?Wt Readings from Last 3 Encounters:  ?04/04/21 201 lb 8 oz (91.4 kg)  ?03/21/21 204 lb (92.5 kg)  ?03/03/21 203 lb (92.1 kg)  ?  ? ? ? ?ASSESSMENT AND PLAN: ? ?Problem List Items Addressed This Visit   ? ?  ? Cardiology Problems  ? Dilated cardiomyopathy (HCC)  ? Relevant Orders  ? EKG 12-Lead  ? Essential hypertension, benign  ? Relevant Orders  ? EKG 12-Lead  ? Atrial fibrillation with RVR (HCC) - Primary  ? Relevant Orders  ? EKG 12-Lead  ? ? ?Atrial fibrillation, persistent ?In normal sinus rhythm today ?Continue Eliquis 5 twice daily with metoprolol 25 twice daily ? ?Frequent PVCs ?Noted on EKG and rhythm strip today ?Recommend she start flecainide 50 twice daily ?Recommend treadmill 2 to 3 weeks ? ?Shortness of breath ?Etiology unclear but concerning for fluid retention ?Recommend she start Lasix 20 mg daily with potassium 10 for the next 4 days then as needed ?Moderate fluid and salt intake ?Reports that she eats out frequently ?Suggested she call 03/05/21 in the next week or so to confirm symptoms are improving ? ?Cardiomyopathy ?Ejection fraction 45% while in atrial fibrillation, presumed to be tachycardia mediated ?Titration of medications limited secondary to low blood pressure while she was in the hospital ?As above for work from suppressing her PVC  burden ?Once improved, repeat ? ?Essential hypertension ?Continue metoprolol for now, BP elevated in the setting of vertigo, rushing to the office ?Recommend to monitor blood pressure at home ? ? ? Total encounter time more than 40 minutes ? Greater than 50% was spent in counseling and coordination of care with the patient ? ? ? ?Signed, ?[place], M.D., Ph.D. ?[facility] [place], [place] ?[phone] ?

## 2021-04-04 ENCOUNTER — Encounter: Payer: Self-pay | Admitting: Cardiovascular Disease

## 2021-04-04 ENCOUNTER — Ambulatory Visit (INDEPENDENT_AMBULATORY_CARE_PROVIDER_SITE_OTHER): Payer: BC Managed Care – PPO | Admitting: Cardiovascular Disease

## 2021-04-04 ENCOUNTER — Other Ambulatory Visit: Payer: Self-pay

## 2021-04-04 VITALS — BP 140/94 | HR 72 | Ht 69.0 in | Wt 201.5 lb

## 2021-04-04 DIAGNOSIS — I420 Dilated cardiomyopathy: Secondary | ICD-10-CM

## 2021-04-04 DIAGNOSIS — I4891 Unspecified atrial fibrillation: Secondary | ICD-10-CM

## 2021-04-04 DIAGNOSIS — I5021 Acute systolic (congestive) heart failure: Secondary | ICD-10-CM

## 2021-04-04 DIAGNOSIS — I493 Ventricular premature depolarization: Secondary | ICD-10-CM | POA: Diagnosis not present

## 2021-04-04 DIAGNOSIS — I10 Essential (primary) hypertension: Secondary | ICD-10-CM | POA: Diagnosis not present

## 2021-04-04 DIAGNOSIS — Z79899 Other long term (current) drug therapy: Secondary | ICD-10-CM

## 2021-04-04 MED ORDER — FUROSEMIDE 20 MG PO TABS: ORAL_TABLET | ORAL | 3 refills | Status: DC

## 2021-04-04 MED ORDER — FLECAINIDE ACETATE 50 MG PO TABS: 50.0000 mg | ORAL_TABLET | Freq: Two times a day (BID) | ORAL | 6 refills | Status: DC

## 2021-04-04 MED ORDER — POTASSIUM CHLORIDE ER 10 MEQ PO TBCR: EXTENDED_RELEASE_TABLET | ORAL | 3 refills | Status: DC

## 2021-04-04 MED ORDER — MECLIZINE HCL 25 MG PO TABS: ORAL_TABLET | ORAL | 0 refills | Status: DC

## 2021-04-04 NOTE — Patient Instructions (Addendum)
Medication Instructions:  ?- Your physician has recommended you make the following change in your medication:  ? ?1) START Lasix (furosemide) 20 mg: ?- take 1 tablet by mouth once daily x 3-4 days, then ?- take 1 tablet by mouth once daily as needed for shortness of breath ? ?2) START potassium 10 meq: ?- take 1 tablet by mouth once daily x 3-4 days (when taking lasix), then ?- take 1 tablet by mouth once  daily as needed (only when taking lasix) ? ? ?3) START flecainide 50 mg: ?- take 1 tablet by mouth twice a day (or every 12 hours) for PVCs ? ?4) START Meclizine 25 mg: ?- take 1 tablet by mouth every 6 hrs as needed for vertigo ? ?If you need a refill on your cardiac medications before your next appointment, please call your pharmacy.  ? ? ? ?Lab work: ?No new labs needed ? ? ? ?Testing/Procedures: ?1) Treadmill stress test: in 10-14 days (for flecainide start) ? ?- Your physician has requested that you have an exercise tolerance test. ? ?- you may eat a light breakfast/ lunch prior to your procedure ?- no caffeine for 24 hours prior to your test (coffee, tea, soft drinks, or chocolate)  ?- no smoking/ vaping for 4 hours prior to your test ?- you may take your regular medications the day of your test unless listed below: ?HOLD lasix & potassium the day of your test ?- bring any inhalers with you to your test ?- wear comfortable clothing & tennis/ non-skid shoes to walk on the treadmill ? ? ?Follow-Up: ?At [facility], you and your health needs are our priority.  As part of our continuing mission to provide you with exceptional heart care, we have created designated Provider Care Teams.  These Care Teams include your primary Cardiologist (physician) and Advanced Practice Providers (APPs -  Physician Assistants and Nurse Practitioners) who all work together to provide you with the care you need, when you need it. ? ? ?You will need a follow up appointment in 1 month ? ?Providers on your designated Care Team:    ?Nicolasa Ducking, NP ?Eula Listen, PA-C ?Cadence Fransico Michael, PA-C ? ?COVID-19 Vaccine Information can be found at: [email or web address] For questions related to vaccine distribution or appointments, please email [email or web address] or call [phone].  ? ? ? ?Flecainide Tablets ?What is this medication? ?FLECAINIDE (FLEK a nide) prevents and treats a fast or irregular heartbeat (arrhythmia). It is often used to treat a type of arrhythmia known as AFib (atrial fibrillation). It works by slowing down overactive electric signals in the heart, which stabilizes your heart rhythm. It belongs to a group of medications called antiarrhythmics. ?This medicine may be used for other purposes; ask your health care provider or pharmacist if you have questions. ?COMMON BRAND NAME(S): Tambocor ?What should I tell my care team before I take this medication? ?They need to know if you have any of these conditions: ?Abnormal levels of potassium in the blood ?Heart disease including heart rhythm and heart rate problems ?Kidney or liver disease ?Recent heart attack ?An unusual or allergic reaction to flecainide, local anesthetics, other medications, foods, dyes, or preservatives ?Pregnant or trying to get pregnant ?Breast-feeding ?How should I use this medication? ?Take this medication by mouth with a glass of water. Follow the directions on the prescription label. You can take this medication with or without food. Take your doses at regular intervals. Do not take your medication more often than directed. Do not stop taking  this medication suddenly. This may cause serious, heart-related side effects. If your care team wants you to stop the medication, the dose may be slowly lowered over time to avoid any side effects. ?Talk to your care team regarding the use of this medication in children. While this medication may be prescribed for children as young as 1 year of age for selected  conditions, precautions do apply. ?Overdosage: If you think you have taken too much of this medicine contact a poison control center or emergency room at once. ?NOTE: This medicine is only for you. Do not share this medicine with others. ?What if I miss a dose? ?If you miss a dose, take it as soon as you can. If it is almost time for your next dose, take only that dose. Do not take double or extra doses. ?What may interact with this medication? ?Do not take this medication with any of the following: ?Amoxapine ?Arsenic trioxide ?Certain antibiotics like clarithromycin, erythromycin, gatifloxacin, gemifloxacin, levofloxacin, moxifloxacin, sparfloxacin, or troleandomycin ?Certain antidepressants called tricyclic antidepressants like amitriptyline, imipramine, or nortriptyline ?Certain medications to control heart rhythm like disopyramide, encainide, moricizine, procainamide, propafenone, and quinidine ?Cisapride ?Delavirdine ?Droperidol ?Haloperidol ?[place] ?Imatinib ?Levomethadyl ?Maprotiline ?Medications for malaria like chloroquine and halofantrine ?Pentamidine ?Phenothiazines like chlorpromazine, mesoridazine, prochlorperazine, thioridazine ?Pimozide ?Quinine ?Ranolazine ?Ritonavir ?Sertindole ?This medication may also interact with the following: ?Cimetidine ?Dofetilide ?Medications for angina or high blood pressure ?Medications to control heart rhythm like amiodarone and digoxin ?Ziprasidone ?This list may not describe all possible interactions. Give your health care provider a list of all the medicines, herbs, non-prescription drugs, or dietary supplements you use. Also tell them if you smoke, drink alcohol, or use illegal drugs. Some items may interact with your medicine. ?What should I watch for while using this medication? ?Visit your care team for regular checks on your progress. Because your condition and the use of this medication carries some risk, it is a good idea to carry an identification card,  necklace or bracelet with details of your condition, medications, and care team. ?Check your blood pressure and pulse rate regularly. Ask your care team what your blood pressure and pulse rate should be, and when you should contact them. Your care team also may schedule regular blood tests and electrocardiograms to check your progress. ?You may get drowsy or dizzy. Do not drive, use machinery, or do anything that needs mental alertness until you know how this medication affects you. Do not stand or sit up quickly, especially if you are an older patient. This reduces the risk of dizzy or fainting spells. Alcohol can make you more dizzy, increase flushing and rapid heartbeats. Avoid alcoholic drinks. ?What side effects may I notice from receiving this medication? ?Side effects that you should report to your care team as soon as possible: ?Allergic reactions--skin rash, itching, hives, swelling of the face, lips, tongue, or throat ?Heart failure--shortness of breath, swelling of the ankles, feet, or hands, sudden weight gain, unusual weakness or fatigue ?Heart rhythm changes--fast or irregular heartbeat, dizziness, feeling faint or lightheaded, chest pain, trouble breathing ?Liver injury--right upper belly pain, loss of appetite, nausea, light-colored stool, dark yellow or brown urine, yellowing skin or eyes, unusual weakness or fatigue ?Side effects that usually do not require medical attention (report to your care team if they continue or are bothersome): ?Blurry vision ?Constipation ?Dizziness ?Fatigue ?Headache ?Nausea ?Tremors or shaking ?This list may not describe all possible side effects. Call your doctor for medical advice about side effects. You may  report side effects to FDA at 1-800-FDA-1088. ?Where should I keep my medication? ?Keep out of the reach of children and pets. ?Store at room temperature between 15 and 30 degrees C (59 and 86 degrees F). Protect from light. Keep container tightly closed. Throw  away any unused medication after the expiration date. ?NOTE: This sheet is a summary. It may not cover all possible information. If you have questions about this medicine, talk to your doctor, pharmacist, o

## 2021-04-11 ENCOUNTER — Ambulatory Visit
Admission: RE | Admit: 2021-04-11 | Discharge: 2021-04-11 | Disposition: A | Discharge status: Home / Self Care | Payer: BC Managed Care – PPO | Source: Ambulatory Visit | Attending: Advanced Practice Midwife | Admitting: Advanced Practice Midwife

## 2021-04-11 DIAGNOSIS — Z1231 Encounter for screening mammogram for malignant neoplasm of breast: Secondary | ICD-10-CM | POA: Diagnosis not present

## 2021-04-11 DIAGNOSIS — Z1239 Encounter for other screening for malignant neoplasm of breast: Secondary | ICD-10-CM | POA: Diagnosis present

## 2021-04-12 ENCOUNTER — Inpatient Hospital Stay
Admission: RE | Admit: 2021-04-12 | Discharge: 2021-04-12 | Disposition: A | Discharge status: Home / Self Care | Payer: Self-pay | Source: Ambulatory Visit | Attending: *Deleted | Admitting: *Deleted

## 2021-04-12 ENCOUNTER — Other Ambulatory Visit: Payer: Self-pay | Admitting: *Deleted

## 2021-04-12 DIAGNOSIS — Z1231 Encounter for screening mammogram for malignant neoplasm of breast: Secondary | ICD-10-CM

## 2021-04-12 NOTE — Progress Notes (Signed)
Contacted via MyChart   Normal mammogram, may repeat in one year:)

## 2021-04-15 ENCOUNTER — Telehealth: Payer: Self-pay | Admitting: Emergency Medicine

## 2021-04-15 NOTE — Telephone Encounter (Signed)
Patient was returning call to discuss testing instructions  ?

## 2021-04-15 NOTE — Telephone Encounter (Signed)
Called patient.  ? ?Reviewed GXT instructions.  ? ?- you may eat a light breakfast/ lunch prior to your procedure ?- no caffeine for 24 hours prior to your test (coffee, tea, soft drinks, or chocolate)  ?- no smoking/ vaping for 4 hours prior to your test ?- you may take your regular medications the day of your test except for: DO NOT take lasix or potassium the morning of your test ?- bring any inhalers with you to your test ?- wear comfortable clothing & tennis/ non-skid shoes to walk on the treadmill ? ? ?

## 2021-04-15 NOTE — Telephone Encounter (Signed)
Called patient to go over instructions for GXT that is scheduled on Monday, 4/10.  ? ?No answer, lmtcb.  ?

## 2021-04-16 DIAGNOSIS — J40 Bronchitis, not specified as acute or chronic: Secondary | ICD-10-CM

## 2021-04-16 DIAGNOSIS — K529 Noninfective gastroenteritis and colitis, unspecified: Secondary | ICD-10-CM

## 2021-04-16 DIAGNOSIS — E860 Dehydration: Secondary | ICD-10-CM

## 2021-04-18 ENCOUNTER — Ambulatory Visit (INDEPENDENT_AMBULATORY_CARE_PROVIDER_SITE_OTHER): Payer: BC Managed Care – PPO

## 2021-04-18 DIAGNOSIS — Z79899 Other long term (current) drug therapy: Secondary | ICD-10-CM | POA: Diagnosis not present

## 2021-04-18 DIAGNOSIS — I493 Ventricular premature depolarization: Secondary | ICD-10-CM

## 2021-04-19 ENCOUNTER — Encounter: Payer: Self-pay | Admitting: Cardiovascular Disease

## 2021-04-19 LAB — EXERCISE TOLERANCE TEST
Angina Index: 0
Estimated workload: 10.1
Exercise duration (min): 8 min
Exercise duration (sec): 39 s
MPHR: 168 {beats}/min
Peak HR: 113 {beats}/min
Percent HR: 67 %
RPE: 8
Rest HR: 61 {beats}/min

## 2021-05-04 ENCOUNTER — Other Ambulatory Visit: Payer: Self-pay | Admitting: Cardiovascular Disease

## 2021-05-04 ENCOUNTER — Other Ambulatory Visit: Payer: Self-pay | Admitting: *Deleted

## 2021-05-04 MED ORDER — POTASSIUM CHLORIDE ER 10 MEQ PO TBCR: EXTENDED_RELEASE_TABLET | ORAL | 0 refills | Status: DC

## 2021-05-04 MED ORDER — FLECAINIDE ACETATE 50 MG PO TABS: 50.0000 mg | ORAL_TABLET | Freq: Two times a day (BID) | ORAL | 0 refills | Status: DC

## 2021-05-04 MED ORDER — APIXABAN 5 MG PO TABS: 5.0000 mg | ORAL_TABLET | Freq: Two times a day (BID) | ORAL | 0 refills | Status: DC

## 2021-05-04 MED ORDER — FUROSEMIDE 20 MG PO TABS: ORAL_TABLET | ORAL | 0 refills | Status: DC

## 2021-05-04 MED ORDER — METOPROLOL TARTRATE 25 MG PO TABS: 25.0000 mg | ORAL_TABLET | Freq: Two times a day (BID) | ORAL | 0 refills | Status: DC

## 2021-05-08 NOTE — Progress Notes (Signed)
Cardiology Office Note ? ?Date:  05/09/2021  ? ?ID:  Courtney Walsh, DOB 02-21-1970, MRN [id number] ? ?PCP:  Marjie Skiff, NP  ? ?Chief Complaint  ?Patient presents with  ? 1 month follow up   ?  Patient c/o shortness of breath and irregular heartbeats with being mostly in the am. Medications reviewed by the patient verbally.   ? ? ?HPI:  ?[name] is a 51 year old woman with past medical history of ?atrial fibrillation ?-CHA2DS2-VASc score 3 (chf, htn, gender) ?In the hospital January 2023 for atrial fibrillation treated with diltiazem infusion ?Requiring TEE and cardioversion, on Eliquis 5 twice daily ?Cardiomyopathy EF 40 to 45% ?Unable to add ACE, ARB Entresto given low blood pressure at this time ?Who presents for follow-up of her atrial fibrillation and PVC ? ?LOV 3/23 ?In follow-up today reports having continued palpitations and episodes of shortness of breath ?Seems to happen more in the mornings, often will smooth out later in the day when she is active at work ?Appreciated symptoms coming into the building this morning ?Leg edema is a orthopnea ? ?Started on flecainide 50 twice daily twice daily for PVCs and atrial fibrillation on last clinic visit ?Reports that it worked for a period of time and then seemed to lose efficacy ? ?No chest pain ? ?EKG personally reviewed by myself on todays visit ?Normal sinus rhythm rate of 65 bpm frequent PVCs in a bigeminal pattern ? ?Hospital records reviewed ?Transesophageal echo February 10, 2021 ? 1. Left ventricular ejection fraction, by estimation, is 40 to 45%. The  ?left ventricle has mildly decreased function. The left ventricle has no  ?regional wall motion abnormalities.  ? 2. Right ventricular systolic function is normal. The right ventricular  ?size is normal.  ? 3. Left atrial size was moderately dilated. No left atrial/left atrial  ?appendage thrombus was detected.  ? 4. The mitral valve is normal in structure. Mild mitral valve   ?regurgitation. ? ? ?PMH:   has a past medical history of Afib (HCC), Hypertension, Irregular heart beat, Overweight, and Thyroid cyst (10/2013). ? ?PSH:    ?Past Surgical History:  ?Procedure Laterality Date  ? CARDIOVERSION N/A 02/10/2021  ? Procedure: CARDIOVERSION;  Surgeon: Antonieta Iba, MD;  Location: [facility] ORS;  Service: Cardiovascular;  Laterality: N/A;  ? NO PAST SURGERIES    ? TEE WITHOUT CARDIOVERSION N/A 02/10/2021  ? Procedure: TRANSESOPHAGEAL ECHOCARDIOGRAM (TEE);  Surgeon: Antonieta Iba, MD;  Location: [facility] ORS;  Service: Cardiovascular;  Laterality: N/A;  ? ? ?Current Outpatient Medications  ?Medication Sig Dispense Refill  ? apixaban (ELIQUIS) 5 MG TABS tablet Take 1 tablet (5 mg total) by mouth 2 (two) times daily. 60 tablet 0  ? furosemide (LASIX) 20 MG tablet Take 1 tablet (20 mg) by mouth once daily as needed for shortness of breath 30 tablet 0  ? metoprolol tartrate (LOPRESSOR) 25 MG tablet Take 1 tablet (25 mg total) by mouth 2 (two) times daily. 60 tablet 0  ? norethindrone (MICRONOR) 0.35 MG tablet Take 1 tablet (0.35 mg total) by mouth daily. 84 tablet 4  ? potassium chloride (KLOR-CON) 10 MEQ tablet Take 1 tablet (10 meq) by mouth once daily as needed when taking lasix (furosemide) 30 tablet 0  ? flecainide (TAMBOCOR) 100 MG tablet Take 1 tablet (100 mg total) by mouth 2 (two) times daily. 60 tablet 0  ? meclizine (ANTIVERT) 25 MG tablet Take 1 tablet (25 mg) by mouth every 6 hours as needed for dizziness/  vertigo (Patient not taking: Reported on 05/09/2021) 30 tablet 0  ? ?No current facility-administered medications for this visit.  ? ? ? ?Allergies:   Patient has no known allergies.  ? ?Social History:  The patient  reports that she has quit smoking. She has never used smokeless tobacco. She reports that she does not currently use alcohol. She reports that she does not use drugs.  ? ?Family History:   family history includes Breast cancer (age of onset: 32) in her paternal aunt;  Heart disease in her maternal grandfather and paternal grandmother; Hypertension in her father, mother, and sister; Stroke in her father.  ? ? ?Review of Systems: ?Review of Systems  ?Constitutional: Negative.   ?HENT: Negative.    ?Respiratory:  Positive for shortness of breath.   ?Cardiovascular:  Positive for palpitations.  ?Gastrointestinal: Negative.   ?Musculoskeletal: Negative.   ?Neurological: Negative.   ?Psychiatric/Behavioral: Negative.    ?All other systems reviewed and are negative. ? ? ?PHYSICAL EXAM: ?VS:  BP 120/70 (BP Location: Left Arm, Patient Position: Sitting, Cuff Size: Normal)   Pulse 65   Ht 5\' 9"  (1.753 m)   Wt 206 lb (93.4 kg)   SpO2 99%   BMI 30.42 kg/m?  , BMI Body mass index is 30.42 kg/m? ?Constitutional:  oriented to person, place, and time. No distress.  ?HENT:  ?Head: Grossly normal ?Eyes:  no discharge. No scleral icterus.  ?Neck: No JVD, no carotid bruits  ?Cardiovascular: Regular rate and rhythm, no murmurs appreciated ?Pulmonary/Chest: Clear to auscultation bilaterally, no wheezes or rails ?Abdominal: Soft.  no distension.  no tenderness.  ?Musculoskeletal: Normal range of motion ?Neurological:  normal muscle tone. Coordination normal. No atrophy ?Skin: Skin warm and dry ?Psychiatric: normal affect, pleasant ? ?Recent Labs: ?02/08/2021: B Natriuretic Peptide 245.9; Magnesium 2.0; Magnesium 2.1 ?02/09/2021: Hemoglobin 14.4; Platelets 210 ?02/17/2021: ALT 23; BUN 7; Creatinine, Ser 0.52; Potassium 4.3; Sodium 139; TSH 1.210  ? ? ?Lipid Panel ?Lab Results  ?Component Value Date  ? CHOL 179 01/23/2017  ? HDL 43 01/23/2017  ? LDLCALC 84 01/23/2017  ? TRIG 258 (H) 01/23/2017  ? ?  ? ?Wt Readings from Last 3 Encounters:  ?05/09/21 206 lb (93.4 kg)  ?04/04/21 201 lb 8 oz (91.4 kg)  ?03/21/21 204 lb (92.5 kg)  ?  ? ?ASSESSMENT AND PLAN: ? ?Problem List Items Addressed This Visit   ? ?  ? Cardiology Problems  ? Dilated cardiomyopathy (HCC)  ? Relevant Medications  ? flecainide (TAMBOCOR)  100 MG tablet  ? Other Relevant Orders  ? EKG 12-Lead  ? Essential hypertension, benign  ? Relevant Medications  ? flecainide (TAMBOCOR) 100 MG tablet  ? Atrial fibrillation with RVR (HCC) - Primary  ? Relevant Medications  ? flecainide (TAMBOCOR) 100 MG tablet  ? Other Relevant Orders  ? EKG 12-Lead  ? ?Other Visit Diagnoses   ? ? PVC (premature ventricular contraction)      ? Relevant Medications  ? flecainide (TAMBOCOR) 100 MG tablet  ? Other Relevant Orders  ? LONG TERM MONITOR (3-14 DAYS)  ? ?  ? ?Atrial fibrillation, persistent ?In normal sinus rhythm today ?Continue Eliquis 5 twice daily with metoprolol 25 twice daily, flecainide as below ? ?Frequent PVCs ?Noted on EKG today in a bigeminal pattern ?Reports having brief improvement in symptoms on flecainide 50 twice daily now again having breakthrough ?Recommend she increase flecainide up to 100 twice daily ?Zio monitor ordered to determine PVC burden ?If no improvement in symptoms  may need to discuss with the EP ? ?Shortness of breath ?Last clinic visit started on Lasix daily as needed ?Was eating out periodically ? ?Cardiomyopathy ?Ejection fraction 45% while in atrial fibrillation, presumed to be tachycardia mediated ?Continue to work on PVC suppression ?Zio monitor ordered for PVCs and A-fib history ? ?Essential hypertension ?Continue metoprolol tartrate twice daily ? ? ? Total encounter time more than 30 minutes ? Greater than 50% was spent in counseling and coordination of care with the patient ? ? ? ?Signed, ?Dossie Arbourim Sahith Nurse, M.D., Ph.D. ?[facility], [place] ?[phone] ?

## 2021-05-09 ENCOUNTER — Encounter: Payer: Self-pay | Admitting: Cardiovascular Disease

## 2021-05-09 ENCOUNTER — Ambulatory Visit (INDEPENDENT_AMBULATORY_CARE_PROVIDER_SITE_OTHER): Payer: BC Managed Care – PPO

## 2021-05-09 ENCOUNTER — Ambulatory Visit (INDEPENDENT_AMBULATORY_CARE_PROVIDER_SITE_OTHER): Payer: BC Managed Care – PPO | Admitting: Cardiovascular Disease

## 2021-05-09 VITALS — BP 120/70 | HR 65 | Ht 69.0 in | Wt 206.0 lb

## 2021-05-09 DIAGNOSIS — I493 Ventricular premature depolarization: Secondary | ICD-10-CM

## 2021-05-09 DIAGNOSIS — I4891 Unspecified atrial fibrillation: Secondary | ICD-10-CM

## 2021-05-09 DIAGNOSIS — I10 Essential (primary) hypertension: Secondary | ICD-10-CM | POA: Diagnosis not present

## 2021-05-09 DIAGNOSIS — I420 Dilated cardiomyopathy: Secondary | ICD-10-CM

## 2021-05-09 MED ORDER — FLECAINIDE ACETATE 100 MG PO TABS: 100.0000 mg | ORAL_TABLET | Freq: Two times a day (BID) | ORAL | 0 refills | Status: DC

## 2021-05-09 NOTE — Patient Instructions (Addendum)
Medication Instructions:  ?Please increase the flecainide up to 100 mg twice a day ? ?If you need a refill on your cardiac medications before your next appointment, please call your pharmacy.  ? ?Lab work: ?No new labs needed ? ?Testing/Procedures: ?Your provider has ordered a heart monitor to wear for 14 days. This will be mailed to your home with instructions on placement. Once you have finished the time frame requested, you will return monitor in box provided. ? ?  ? ?Follow-Up: ?At [facility], you and your health needs are our priority.  As part of our continuing mission to provide you with exceptional heart care, we have created designated Provider Care Teams.  These Care Teams include your primary Cardiologist (physician) and Advanced Practice Providers (APPs -  Physician Assistants and Nurse Practitioners) who all work together to provide you with the care you need, when you need it. ? ?You will need a follow up appointment in 3 month ? ?Providers on your designated Care Team:   ?Nicolasa Ducking, NP ?Eula Listen, PA-C ?Cadence Fransico Michael, PA-C ? ?COVID-19 Vaccine Information can be found at: [email or web address] For questions related to vaccine distribution or appointments, please email [email or web address] or call [phone].  ? ?

## 2021-05-10 ENCOUNTER — Other Ambulatory Visit: Payer: Self-pay | Admitting: Cardiovascular Disease

## 2021-05-11 MED ORDER — METOPROLOL TARTRATE 25 MG PO TABS: 25.0000 mg | ORAL_TABLET | Freq: Two times a day (BID) | ORAL | 2 refills | Status: DC

## 2021-05-14 DIAGNOSIS — I493 Ventricular premature depolarization: Secondary | ICD-10-CM

## 2021-05-17 ENCOUNTER — Other Ambulatory Visit: Payer: Self-pay | Admitting: Nurse Practitioner

## 2021-05-18 NOTE — Telephone Encounter (Signed)
Requested medications are due for refill today.  no ? ?Requested medications are on the active medications list.  no ? ?Last refill. 03/05/2020 ? ?Future visit scheduled.   yes ? ?Notes to clinic.  Medication was discontinued 02/10/2021. ? ? ? ?Requested Prescriptions  ?Pending Prescriptions Disp Refills  ? hydrochlorothiazide (HYDRODIURIL) 25 MG tablet [Pharmacy Med Name: HYDROCHLOROTHIAZIDE 25 MG TAB] 90 tablet 4  ?  Sig: Take 1 tablet (25 mg total) by mouth daily.  ?  ? Cardiovascular: Diuretics - Thiazide Failed - 05/17/2021  2:32 AM  ?  ?  Failed - Cr in normal range and within 180 days  ?  Creatinine, Ser  ?Date Value Ref Range Status  ?02/17/2021 0.52 (L) 0.57 - 1.00 mg/dL Final  ?  ?  ?  ?  Passed - K in normal range and within 180 days  ?  Potassium  ?Date Value Ref Range Status  ?02/17/2021 4.3 3.5 - 5.2 mmol/L Final  ?  ?  ?  ?  Passed - Na in normal range and within 180 days  ?  Sodium  ?Date Value Ref Range Status  ?02/17/2021 139 134 - 144 mmol/L Final  ?  ?  ?  ?  Passed - Last BP in normal range  ?  BP Readings from Last 1 Encounters:  ?05/09/21 120/70  ?  ?  ?  ?  Passed - Valid encounter within last 6 months  ?  Recent Outpatient Visits   ? ?      ? 1 month ago Atrial fibrillation with RVR (HCC)  ? [facility] [place], [place] T, NP  ? 3 months ago Atrial fibrillation, unspecified type (HCC)  ? [facility] Vigg, Avanti, MD  ? 3 months ago Abnormal heart rate  ? [facility] Vigg, Avanti, MD  ? 1 year ago Essential hypertension, benign  ? [facility] [place], Corrie Dandy T, NP  ? 2 years ago Breast cancer screening  ? [facility] [place], Corrie Dandy T, NP  ? ?  ?  ?Future Appointments   ? ?        ? In 3 months Gollan, Tollie Pizza, MD [facility]  ? In 4 months Cannady, Dorie Rank, NP [facility]  ? ?  ? ? ?  ?  ?  ?  ?

## 2021-05-25 ENCOUNTER — Telehealth: Payer: Self-pay

## 2021-05-25 NOTE — Telephone Encounter (Signed)
Patient requesting refill of norethindrone. My chart app states rx can not be refilled thru my chart at this time, but she does have 4 refills available. She is on her last week. Cb#[phone]. ?

## 2021-05-25 NOTE — Telephone Encounter (Signed)
Left voicemail message to notify patient to contact pharmacy to process the refill. My chart does not allow refill requests when there are refills on the current prescription. The pharmacy usually sends and automatic refill request when the last refill has been used/processed. Advised to return call with any further questions/concerns. ?

## 2021-05-31 ENCOUNTER — Other Ambulatory Visit: Payer: Self-pay | Admitting: Cardiovascular Disease

## 2021-06-03 ENCOUNTER — Other Ambulatory Visit: Payer: Self-pay | Admitting: Cardiovascular Disease

## 2021-06-03 DIAGNOSIS — I4891 Unspecified atrial fibrillation: Secondary | ICD-10-CM

## 2021-06-03 NOTE — Telephone Encounter (Signed)
Refill request

## 2021-06-03 NOTE — Telephone Encounter (Signed)
Eliquis 5mg  refill request received. Patient is 51 years old, weight-93.4kg, Crea-0.52 on 02/17/2021, Diagnosis-Afib, and last seen by Dr. 04/17/2021 on 05/09/2021. Dose is appropriate based on dosing criteria. Will send in refill to requested pharmacy.

## 2021-06-09 ENCOUNTER — Telehealth: Payer: Self-pay | Admitting: Emergency Medicine

## 2021-06-09 NOTE — Telephone Encounter (Signed)
-----   Message from Minna Merritts, MD sent at 06/09/2021 12:20 PM EDT ----- Event monitor 6% burden of PVCs, ok amount of PVCs if symptoms are ok Can we discuss with her whether the higher dose flecainide 100 BID with metoprolol is helping symptoms?

## 2021-06-09 NOTE — Telephone Encounter (Signed)
Called patient. No answer. Lmtcb.  

## 2021-06-10 MED ORDER — MECLIZINE HCL 25 MG PO TABS
ORAL_TABLET | ORAL | 0 refills | Status: DC
Start: 2021-06-10 — End: 2022-02-13

## 2021-06-10 NOTE — Telephone Encounter (Signed)
Patient is returning call. Please return call to [phone].

## 2021-06-10 NOTE — Telephone Encounter (Signed)
Please review for refill.  

## 2021-06-10 NOTE — Telephone Encounter (Signed)
Called patient. No answer. Left message.   Will attempt to call back later.

## 2021-06-10 NOTE — Telephone Encounter (Signed)
Patient is returning call.  °

## 2021-06-10 NOTE — Telephone Encounter (Signed)
Called patient and reviewed results with her. Pt verbalized understanding.   Patient reports that her symptoms are much improved with the medication changes. Understands that she can call with any concerns, increase in symptoms, or new symptoms.

## 2021-08-08 ENCOUNTER — Other Ambulatory Visit: Payer: Self-pay | Admitting: Cardiovascular Disease

## 2021-08-21 NOTE — Progress Notes (Signed)
Cardiology Office Note  Date:  08/22/2021   ID:  Courtney Walsh, DOB 1970-12-11, MRN [id number]  PCP:  Marjie Skiff, NP   Chief Complaint  Patient presents with   3 month follow up & discuss Zio monitor.    Patient c/o shortness of breath this morning. Medications reviewed by the patient verbally.     HPI:  Courtney Walsh is a 51 year old woman with past medical history of Remote smoking hx, stopped 30 years ago atrial fibrillation -CHA2DS2-VASc score 3 (chf, htn, gender) In the hospital January 2023 for atrial fibrillation treated with diltiazem infusion Requiring TEE and cardioversion, on Eliquis 5 twice daily Cardiomyopathy EF 40 to 45%, in atrial fib Unable to add ACE, ARB Entresto given low blood pressure at this time Who presents for follow-up of her atrial fibrillation and PVC  Last seen in clinic by myself May 2023  In follow-up today reports she is doing well on flecainide 100 twice daily with metoprolol tartrate 25 twice daily  Event monitor reviewed 6% burden of PVCs,  In general is asymptomatic  Occasional shortness of breath, more in the morning, seems to resolve without intervention Denies leg swelling, abdominal distention concerning for fluid retention  Prior ejection fraction mildly depressed in the setting of atrial fibrillation, No repeat echo in normal sinus rhythm, this was discussed with her  EKG personally reviewed by myself on todays visit Normal sinus rhythm rate of 62 bpm no significant ST-T wave changes  Hospital records reviewed Transesophageal echo /carotid vision February 10, 2021, in atrial fibrillation  1. Left ventricular ejection fraction, by estimation, is 40 to 45%. The  left ventricle has mildly decreased function. The left ventricle has no  regional wall motion abnormalities.   2. Right ventricular systolic function is normal. The right ventricular  size is normal.   3. Left atrial size was moderately dilated. No left  atrial/left atrial  appendage thrombus was detected.   4. The mitral valve is normal in structure. Mild mitral valve  regurgitation.   PMH:   has a past medical history of Afib (HCC), Hypertension, Irregular heart beat, Overweight, and Thyroid cyst (10/2013).  PSH:    Past Surgical History:  Procedure Laterality Date   CARDIOVERSION N/A 02/10/2021   Procedure: CARDIOVERSION;  Surgeon: Antonieta Iba, MD;  Location: [facility] ORS;  Service: Cardiovascular;  Laterality: N/A;   NO PAST SURGERIES     TEE WITHOUT CARDIOVERSION N/A 02/10/2021   Procedure: TRANSESOPHAGEAL ECHOCARDIOGRAM (TEE);  Surgeon: Antonieta Iba, MD;  Location: [facility] ORS;  Service: Cardiovascular;  Laterality: N/A;    Current Outpatient Medications  Medication Sig Dispense Refill   apixaban (ELIQUIS) 5 MG TABS tablet TAKE 1 TABLET BY MOUTH TWICE A DAY 60 tablet 10   flecainide (TAMBOCOR) 100 MG tablet TAKE 1 TABLET BY MOUTH TWICE A DAY 60 tablet 3   furosemide (LASIX) 20 MG tablet Take 20 mg by mouth daily as needed. For shortness of breath     meclizine (ANTIVERT) 25 MG tablet Take 1 tablet (25 mg) by mouth every 6 hours as needed for dizziness/ vertigo 30 tablet 0   metoprolol tartrate (LOPRESSOR) 25 MG tablet TAKE 1 TABLET BY MOUTH TWICE A DAY 180 tablet 0   potassium chloride (MICRO-K) 10 MEQ CR capsule Take 10 mEq by mouth daily as needed. When taking the furosemide (lasix)     norethindrone (MICRONOR) 0.35 MG tablet Take 1 tablet (0.35 mg total) by mouth daily. (Patient not taking: Reported on  08/22/2021) 84 tablet 4   No current facility-administered medications for this visit.     Allergies:   Patient has no known allergies.   Social History:  The patient  reports that she has quit smoking. She has never used smokeless tobacco. She reports that she does not currently use alcohol. She reports that she does not use drugs.   Family History:   family history includes Breast cancer (age of onset: 47) in her paternal  aunt; Heart disease in her maternal grandfather and paternal grandmother; Hypertension in her father, mother, and sister; Stroke in her father.    Review of Systems: Review of Systems  Constitutional: Negative.   HENT: Negative.    Respiratory:  Positive for shortness of breath.   Cardiovascular:  Positive for palpitations.  Gastrointestinal: Negative.   Musculoskeletal: Negative.   Neurological: Negative.   Psychiatric/Behavioral: Negative.    All other systems reviewed and are negative.    PHYSICAL EXAM: VS:  BP (!) 142/90 (BP Location: Left Arm, Patient Position: Sitting, Cuff Size: Normal)   Pulse 62   Ht 5\' 9"  (1.753 m)   Wt 213 lb 6 oz (96.8 kg)   SpO2 99%   BMI 31.51 kg/m  , BMI Body mass index is 31.51 kg/m. Constitutional:  oriented to person, place, and time. No distress.  HENT:  Head: Grossly normal Eyes:  no discharge. No scleral icterus.  Neck: No JVD, no carotid bruits  Cardiovascular: Regular rate and rhythm, no murmurs appreciated Pulmonary/Chest: Clear to auscultation bilaterally, no wheezes or rails Abdominal: Soft.  no distension.  no tenderness.  Musculoskeletal: Normal range of motion Neurological:  normal muscle tone. Coordination normal. No atrophy Skin: Skin warm and dry Psychiatric: normal affect, pleasant  Recent Labs: 02/08/2021: B Natriuretic Peptide 245.9; Magnesium 2.0; Magnesium 2.1 02/09/2021: Hemoglobin 14.4; Platelets 210 02/17/2021: ALT 23; BUN 7; Creatinine, Ser 0.52; Potassium 4.3; Sodium 139; TSH 1.210    Lipid Panel Lab Results  Component Value Date   CHOL 179 01/23/2017   HDL 43 01/23/2017   LDLCALC 84 01/23/2017   TRIG 258 (H) 01/23/2017      Wt Readings from Last 3 Encounters:  08/22/21 213 lb 6 oz (96.8 kg)  05/09/21 206 lb (93.4 kg)  04/04/21 201 lb 8 oz (91.4 kg)     ASSESSMENT AND PLAN:  Problem List Items Addressed This Visit       Cardiology Problems   Dilated cardiomyopathy (HCC)   Relevant Medications    furosemide (LASIX) 20 MG tablet   Other Relevant Orders   EKG 12-Lead   Essential hypertension, benign   Relevant Medications   furosemide (LASIX) 20 MG tablet   Atrial fibrillation with RVR (HCC) - Primary   Relevant Medications   furosemide (LASIX) 20 MG tablet   Other Relevant Orders   EKG 12-Lead   Other Visit Diagnoses     PVC (premature ventricular contraction)       Relevant Medications   furosemide (LASIX) 20 MG tablet   Other Relevant Orders   EKG 12-Lead     Atrial fibrillation, persistent In normal sinus rhythm today Continue Eliquis 5 twice daily with metoprolol 25 twice daily, flecainide 100 twice daily  Frequent PVCs Continue flecainide with metoprolol as above 6 percent burden on monitor  Shortness of breath Rare short episodes in the morning, typically resolved by afternoon Etiology unclear, was given Lasix as needed on previous office visit Denies leg swelling, abdominal distention  Cardiomyopathy Ejection fraction 45% while  in atrial fibrillation, presumed to be tachycardia mediated Discussed repeating echocardiogram, she prefers to wait  Essential hypertension Continue metoprolol tartrate twice daily Borderline elevated today, recommend she monitor blood pressure at home   Total encounter time more than 30 minutes  Greater than 50% was spent in counseling and coordination of care with the patient    Signed, Dossie Arbour, M.D., Ph.D. [facility] [place], [place] [phone]

## 2021-08-22 ENCOUNTER — Encounter: Payer: Self-pay | Admitting: Cardiovascular Disease

## 2021-08-22 ENCOUNTER — Ambulatory Visit (INDEPENDENT_AMBULATORY_CARE_PROVIDER_SITE_OTHER): Payer: BC Managed Care – PPO | Admitting: Cardiovascular Disease

## 2021-08-22 VITALS — BP 142/90 | HR 62 | Ht 69.0 in | Wt 213.4 lb

## 2021-08-22 DIAGNOSIS — I493 Ventricular premature depolarization: Secondary | ICD-10-CM | POA: Diagnosis not present

## 2021-08-22 DIAGNOSIS — I10 Essential (primary) hypertension: Secondary | ICD-10-CM

## 2021-08-22 DIAGNOSIS — I420 Dilated cardiomyopathy: Secondary | ICD-10-CM

## 2021-08-22 DIAGNOSIS — I4891 Unspecified atrial fibrillation: Secondary | ICD-10-CM

## 2021-08-22 NOTE — Patient Instructions (Signed)
Medication Instructions:  No changes  If you need a refill on your cardiac medications before your next appointment, please call your pharmacy.    Lab work: No new labs needed   Testing/Procedures: No new testing needed   Follow-Up: At [facility], you and your health needs are our priority.  As part of our continuing mission to provide you with exceptional heart care, we have created designated Provider Care Teams.  These Care Teams include your primary Cardiologist (physician) and Advanced Practice Providers (APPs -  Physician Assistants and Nurse Practitioners) who all work together to provide you with the care you need, when you need it.  You will need a follow up appointment in 6 months  Providers on your designated Care Team:   Christopher Berge, NP Ryan Dunn, PA-C Cadence Furth, PA-C  COVID-19 Vaccine Information can be found at: [email or web address] For questions related to vaccine distribution or appointments, please email [email or web address] or call [phone].   

## 2021-09-02 ENCOUNTER — Other Ambulatory Visit: Payer: Self-pay | Admitting: Cardiovascular Disease

## 2021-09-26 ENCOUNTER — Encounter: Payer: BC Managed Care – PPO | Admitting: Nurse Practitioner

## 2021-09-27 ENCOUNTER — Other Ambulatory Visit: Payer: Self-pay | Admitting: Cardiovascular Disease

## 2021-09-27 ENCOUNTER — Encounter: Payer: Self-pay | Admitting: Cardiovascular Disease

## 2021-11-07 ENCOUNTER — Other Ambulatory Visit: Payer: Self-pay | Admitting: Cardiovascular Disease

## 2022-02-02 ENCOUNTER — Other Ambulatory Visit: Payer: Self-pay | Admitting: Cardiovascular Disease

## 2022-02-12 NOTE — Progress Notes (Unsigned)
Courtney Lick, NP   No chief complaint on file.   HPI:      [name] is a 52 y.o. [id number] whose LMP was No LMP recorded. (Menstrual status: Oral contraceptives)., presents today for LT breat mass???  Neg mammo 4/23  Patient Active Problem List   Diagnosis Date Noted   Dilated cardiomyopathy ([place]) 04/03/2021   Acute HF (heart failure) ([place]) 03/19/2021   Atrial fibrillation with RVR ([place]) 02/08/2021   Psoriasis 03/05/2020   Obesity 11/14/2014   Essential hypertension, benign 11/09/2014    Past Surgical History:  Procedure Laterality Date   CARDIOVERSION N/A 02/10/2021   Procedure: CARDIOVERSION;  Surgeon: Minna Merritts, MD;  Location: [facility] OR[facility];  Service: Cardiovascular;  Laterality: N/A;   NO PAST SURGERIES     TEE WITHOUT CARDIOVERSION N/A 02/10/2021   Procedure: TRANSESOPHAGEAL ECHOCARDIOGRAM (TEE);  Surgeon: Minna Merritts, MD;  Location: [facility] ORS;  Service: Cardiovascular;  Laterality: N/A;    Family History  Problem Relation Age of Onset   Hypertension Mother    Hypertension Father    Stroke Father    Heart disease Maternal Grandfather    Heart disease Paternal Grandmother    Hypertension Sister    Breast cancer Paternal Aunt 20       contact   Cancer Neg Hx    Diabetes Neg Hx    COPD Neg Hx     Social History   Socioeconomic History   Marital status: Married    Spouse name: Not on file   Number of children: Not on file   Years of education: Not on file   Highest education level: Not on file  Occupational History   Not on file  Tobacco Use   Smoking status: Former   Smokeless tobacco: Never  Vaping Use   Vaping Use: Never used  Substance and Sexual Activity   Alcohol use: Not Currently   Drug use: Never   Sexual activity: Yes    Partners: Male    Birth control/protection: Pill  Other Topics Concern   Not on file  Social History Narrative   Not on file   Social Determinants of Health   Financial Resource Strain: Not  on file  Food Insecurity: Not on file  Transportation Needs: Not on file  Physical Activity: Not on file  Stress: Not on file  Social Connections: Not on file  Intimate Partner Violence: Not on file    Outpatient Medications Prior to Visit  Medication Sig Dispense Refill   apixaban (ELIQUIS) 5 MG TABS tablet TAKE 1 TABLET BY MOUTH TWICE A DAY 60 tablet 10   flecainide (TAMBOCOR) 100 MG tablet TAKE 1 TABLET BY MOUTH TWICE A DAY 180 tablet 1   furosemide (LASIX) 20 MG tablet Take 20 mg by mouth daily as needed. For shortness of breath     meclizine (ANTIVERT) 25 MG tablet Take 1 tablet (25 mg) by mouth every 6 hours as needed for dizziness/ vertigo 30 tablet 0   metoprolol tartrate (LOPRESSOR) 25 MG tablet TAKE 1 TABLET BY MOUTH TWICE A DAY 180 tablet 0   norethindrone (MICRONOR) 0.35 MG tablet Take 1 tablet (0.35 mg total) by mouth daily. (Patient not taking: Reported on 08/22/2021) 84 tablet 4   potassium chloride (MICRO-K) 10 MEQ CR capsule Take 10 mEq by mouth daily as needed. When taking the furosemide (lasix)     No facility-administered medications prior to visit.      ROS:  Review  of Systems BREAST: No symptoms   OBJECTIVE:   Vitals:  There were no vitals taken for this visit.  Physical Exam  Results: No results found for this or any previous visit (from the past 24 hour(s)).   Assessment/Plan: No diagnosis found.    No orders of the defined types were placed in this encounter.     No follow-ups on file.  Laiyla Slagel B. Nila Winker, PA-C 02/12/2022 5:19 PM

## 2022-02-13 ENCOUNTER — Ambulatory Visit (INDEPENDENT_AMBULATORY_CARE_PROVIDER_SITE_OTHER): Payer: BC Managed Care – PPO | Admitting: Obstetrics and Gynecology

## 2022-02-13 ENCOUNTER — Encounter: Payer: Self-pay | Admitting: Obstetrics and Gynecology

## 2022-02-13 VITALS — BP 106/60 | Ht 69.0 in | Wt 216.0 lb

## 2022-02-13 DIAGNOSIS — N6322 Unspecified lump in the left breast, upper inner quadrant: Secondary | ICD-10-CM | POA: Diagnosis not present

## 2022-02-13 NOTE — Patient Instructions (Signed)
I value your feedback and you entrusting us with your care. If you get a  patient survey, I would appreciate you taking the time to let us know about your experience today. Thank you! ? ? ?

## 2022-02-19 NOTE — Progress Notes (Unsigned)
Cardiology Office Note  Date:  02/20/2022   ID:  Courtney Walsh, DOB 07-11-1970, MRN [id number]  PCP:  Venita Lick, NP   Chief Complaint  Patient presents with   6 month follow up     Patient c/o shortness of breath at times. Medications reviewed by the patient verbally.     HPI:  [name] is a 52 year old woman with past medical history of Remote smoking hx, stopped 30 years ago atrial fibrillation -CHA2DS2-VASc score 3 (chf, htn, gender) In the hospital January 2023 for atrial fibrillation treated with diltiazem infusion Requiring TEE and cardioversion, on Eliquis 5 twice daily Cardiomyopathy EF 40 to 45%, in atrial fib Unable to add ACE, ARB Entresto given low blood pressure at this time Who presents for follow-up of her atrial fibrillation and PVC  Last seen in clinic by myself August 2023  Denies significant breakthrough for atrial fibrillation Maintain on flecainide 100 twice daily with metoprolol tartrate 25 twice daily  Prior event monitor  6% burden of PVCs,   BP elevated on initial check today, has not been check at home recently Repeat check down to 128/80 Continues to have shortness of breath symptoms,SOB is random  Works 6 days a week Active at work No regular exercise program Weight trending higher, 201 last year , now up to 216 Has not tried Lasix for shortness of breath on a regular basis Denies significant leg swelling  Prior ejection fraction mildly depressed in the setting of atrial fibrillation, 40-45 to send No repeat echo in normal sinus rhythm,  EKG personally reviewed by myself on todays visit Normal sinus rhythm rate of 61 bpm no significant ST-T wave changes  Hospital records reviewed Transesophageal echo /carotid vision February 10, 2021, in atrial fibrillation  1. Left ventricular ejection fraction, by estimation, is 40 to 45%. The  left ventricle has mildly decreased function. The left ventricle has no  regional wall  motion abnormalities.   2. Right ventricular systolic function is normal. The right ventricular  size is normal.   3. Left atrial size was moderately dilated. No left atrial/left atrial  appendage thrombus was detected.   4. The mitral valve is normal in structure. Mild mitral valve  regurgitation.   PMH:   has a past medical history of Afib ([place]), Hypertension, Irregular heart beat, Overweight, and Thyroid cyst (10/2013).  PSH:    Past Surgical History:  Procedure Laterality Date   CARDIOVERSION N/A 02/10/2021   Procedure: CARDIOVERSION;  Surgeon: Minna Merritts, MD;  Location: [facility] OR[facility];  Service: Cardiovascular;  Laterality: N/A;   TEE WITHOUT CARDIOVERSION N/A 02/10/2021   Procedure: TRANSESOPHAGEAL ECHOCARDIOGRAM (TEE);  Surgeon: Minna Merritts, MD;  Location: [facility] OR[facility];  Service: Cardiovascular;  Laterality: N/A;    Current Outpatient Medications  Medication Sig Dispense Refill   apixaban (ELIQUIS) 5 MG TABS tablet TAKE 1 TABLET BY MOUTH TWICE A DAY 60 tablet 10   flecainide (TAMBOCOR) 100 MG tablet TAKE 1 TABLET BY MOUTH TWICE A DAY 180 tablet 1   metoprolol tartrate (LOPRESSOR) 25 MG tablet TAKE 1 TABLET BY MOUTH TWICE A DAY 180 tablet 0   norethindrone (MICRONOR) 0.35 MG tablet Take 1 tablet (0.35 mg total) by mouth daily. 84 tablet 4   furosemide (LASIX) 20 MG tablet Take 20 mg by mouth daily as needed. For shortness of breath (Patient not taking: Reported on 02/20/2022)     potassium chloride (MICRO-K) 10 MEQ CR capsule Take 10 mEq by mouth daily as  needed. When taking the furosemide (lasix) (Patient not taking: Reported on 02/20/2022)     No current facility-administered medications for this visit.    Allergies:   Patient has no known allergies.   Social History:  The patient  reports that she has quit smoking. She has never used smokeless tobacco. She reports that she does not currently use alcohol. She reports that she does not use drugs.   Family History:   family  history includes Breast cancer (age of onset: 50) in her paternal aunt; Heart disease in her maternal grandfather and paternal grandmother; Hypertension in her father, mother, and sister; Stroke in her father.    Review of Systems: Review of Systems  Constitutional: Negative.   HENT: Negative.    Respiratory:  Positive for shortness of breath.   Cardiovascular: Negative.   Gastrointestinal: Negative.   Musculoskeletal: Negative.   Neurological: Negative.   Psychiatric/Behavioral: Negative.    All other systems reviewed and are negative.   PHYSICAL EXAM: VS:  BP 128/80 (BP Location: Left Arm)   Pulse 61   Ht 5' 9"$  (1.753 m)   Wt 216 lb 4 oz (98.1 kg)   SpO2 98%   BMI 31.93 kg/m  , BMI Body mass index is 31.93 kg/m. Constitutional:  oriented to person, place, and time. No distress.  HENT:  Head: Grossly normal Eyes:  no discharge. No scleral icterus.  Neck: No JVD, no carotid bruits  Cardiovascular: Regular rate and rhythm, no murmurs appreciated Pulmonary/Chest: Clear to auscultation bilaterally, no wheezes or rails Abdominal: Soft.  no distension.  no tenderness.  Musculoskeletal: Normal range of motion Neurological:  normal muscle tone. Coordination normal. No atrophy Skin: Skin warm and dry Psychiatric: normal affect, pleasant  Recent Labs: No results found for requested labs within last 365 days.    Lipid Panel Lab Results  Component Value Date   CHOL 179 01/23/2017   HDL 43 01/23/2017   LDLCALC 84 01/23/2017   TRIG 258 (H) 01/23/2017    Wt Readings from Last 3 Encounters:  02/20/22 216 lb 4 oz (98.1 kg)  02/13/22 216 lb (98 kg)  08/22/21 213 lb 6 oz (96.8 kg)     ASSESSMENT AND PLAN:  Problem List Items Addressed This Visit       Cardiology Problems   Dilated cardiomyopathy ([place])   Essential hypertension, benign   Atrial fibrillation with RVR ([place]) - Primary     Other   Obesity   Other Visit Diagnoses     PVC (premature ventricular  contraction)         Atrial fibrillation, persistent In normal sinus rhythm today Continue Eliquis 5 twice daily with metoprolol 25 twice daily, flecainide 100 twice daily  Frequent PVCs Continue flecainide with metoprolol as above 6 percent burden on monitor Asymptomatic  Shortness of breath Any time of day, etiology unclear Recommend repeat echocardiogram, she will talk to her husband Prior EF 40 to 45% in atrial fibrillation, no new baseline available Recommend she try Lasix on a more regular basis such as every other day for several weeks to see if this helps her symptoms  Cardiomyopathy Ejection fraction 40 -45% while in atrial fibrillation, presumed to be tachycardia mediated Recommend repeat echocardiogram to estimate ejection fraction She will call us back if she would like to schedule this We did discuss that various medication changes would be made if ejection fraction remained low  Essential hypertension Continue metoprolol tartrate twice daily Blood pressure elevated on initial evaluation, improved  on recheck  Weight gain We would recommend continued exercise, careful diet management in an effort to lose weight.    Total encounter time more than 30 minutes  Greater than 50% was spent in counseling and coordination of care with the patient    Signed, Esmond Plants, M.D., Ph.D. [place], [place]

## 2022-02-20 ENCOUNTER — Ambulatory Visit: Payer: BC Managed Care – PPO | Attending: Cardiovascular Disease | Admitting: Cardiovascular Disease

## 2022-02-20 ENCOUNTER — Encounter: Payer: Self-pay | Admitting: Cardiovascular Disease

## 2022-02-20 VITALS — BP 128/80 | HR 61 | Ht 69.0 in | Wt 216.2 lb

## 2022-02-20 DIAGNOSIS — I420 Dilated cardiomyopathy: Secondary | ICD-10-CM | POA: Diagnosis not present

## 2022-02-20 DIAGNOSIS — E6609 Other obesity due to excess calories: Secondary | ICD-10-CM

## 2022-02-20 DIAGNOSIS — Z6830 Body mass index (BMI) 30.0-30.9, adult: Secondary | ICD-10-CM

## 2022-02-20 DIAGNOSIS — I10 Essential (primary) hypertension: Secondary | ICD-10-CM

## 2022-02-20 DIAGNOSIS — I4891 Unspecified atrial fibrillation: Secondary | ICD-10-CM | POA: Diagnosis not present

## 2022-02-20 DIAGNOSIS — I493 Ventricular premature depolarization: Secondary | ICD-10-CM | POA: Diagnosis not present

## 2022-02-20 MED ORDER — METOPROLOL TARTRATE 25 MG PO TABS: 25.0000 mg | ORAL_TABLET | Freq: Two times a day (BID) | ORAL | 0 refills | Status: DC

## 2022-02-20 MED ORDER — FLECAINIDE ACETATE 100 MG PO TABS: 100.0000 mg | ORAL_TABLET | Freq: Two times a day (BID) | ORAL | 1 refills | Status: DC

## 2022-02-20 NOTE — Patient Instructions (Signed)
Medication Instructions:  No changes  If you need a refill on your cardiac medications before your next appointment, please call your pharmacy.    Lab work: No new labs needed   Testing/Procedures: No new testing needed   Follow-Up: At [facility], you and your health needs are our priority.  As part of our continuing mission to provide you with exceptional heart care, we have created designated Provider Care Teams.  These Care Teams include your primary Cardiologist (physician) and Advanced Practice Providers (APPs -  Physician Assistants and Nurse Practitioners) who all work together to provide you with the care you need, when you need it.  You will need a follow up appointment in 6 months  Providers on your designated Care Team:   Christopher Berge, NP Ryan Dunn, PA-C Cadence Furth, PA-C  COVID-19 Vaccine Information can be found at: [email or web address] For questions related to vaccine distribution or appointments, please email [email or web address] or call [phone].   

## 2022-03-06 ENCOUNTER — Ambulatory Visit: Payer: BC Managed Care – PPO | Admitting: Obstetrics

## 2022-03-26 ENCOUNTER — Other Ambulatory Visit: Payer: Self-pay | Admitting: Cardiovascular Disease

## 2022-03-26 DIAGNOSIS — I4891 Unspecified atrial fibrillation: Secondary | ICD-10-CM

## 2022-03-27 NOTE — Telephone Encounter (Signed)
Prescription refill request for Eliquis received. Indication: AF Last office visit: 02/20/22  Johnny Bridge MD Scr: 0.52 on 02/17/21  Epic  Has upcoming Physical 4/24 Age: 52 Weight: 98.1 kg  Based on above findings Eliquis 5mg  twice daily is the appropriate dose.  Refill approved.

## 2022-03-27 NOTE — Telephone Encounter (Signed)
Refill Request.  

## 2022-04-23 ENCOUNTER — Other Ambulatory Visit: Payer: Self-pay | Admitting: Advanced Practice Midwife

## 2022-04-23 DIAGNOSIS — Z3041 Encounter for surveillance of contraceptive pills: Secondary | ICD-10-CM

## 2022-04-23 DIAGNOSIS — Z0000 Encounter for general adult medical examination without abnormal findings: Secondary | ICD-10-CM

## 2022-04-27 ENCOUNTER — Encounter: Payer: Self-pay | Admitting: Obstetrics and Gynecology

## 2022-04-27 DIAGNOSIS — Z3041 Encounter for surveillance of contraceptive pills: Secondary | ICD-10-CM

## 2022-04-27 DIAGNOSIS — Z0000 Encounter for general adult medical examination without abnormal findings: Secondary | ICD-10-CM

## 2022-04-28 MED ORDER — NORETHINDRONE 0.35 MG PO TABS: 1.0000 | ORAL_TABLET | Freq: Every day | ORAL | 0 refills | Status: DC

## 2022-05-07 NOTE — Progress Notes (Unsigned)
PCP: Marjie Skiff, NP   No chief complaint on file.   HPI:      [name] is a 52 y.o. [id number] whose LMP was No LMP recorded. (Menstrual status: Oral contraceptives)., presents today for her annual examination.  Her menses are {norm/abn:715}, lasting {number: 22536} days.  Dysmenorrhea {dysmen:716}. She {does:18564} have intermenstrual bleeding. She {does:18564} have vasomotor sx.   Sex activity: single partner, contraception - oral progesterone-only contraceptive. She {does:18564} have vaginal dryness.  Last Pap: 01/23/17 Results were: no abnormalities /neg HPV DNA.  Hx of STDs: {STD hx:14358}  Last mammogram: 04/11/21  Results were: normal--routine follow-up in 12 months There is a FH of breast cancer in her pat aunt, geneti testing not indicated. There is no FH of ovarian cancer. The patient {does:18564} do self-breast exams. Complaints of LT breast mass 2/24 with neg breast exam by me and pt at appt.   Colonoscopy: {hx:15363}  Repeat due after 10*** years.   Tobacco use: {tob:20664} Alcohol use: {Alcohol:11675} No drug use Exercise: {exercise:31265}  She {does:18564} get adequate calcium and Vitamin D in her diet.  Labs with PCP.   Patient Active Problem List   Diagnosis Date Noted   Dilated cardiomyopathy ([facility]) 04/03/2021   Acute HF (heart failure) ([facility]) 03/19/2021   Atrial fibrillation with RVR ([facility]) 02/08/2021   Psoriasis 03/05/2020   Obesity 11/14/2014   Essential hypertension, benign 11/09/2014    Past Surgical History:  Procedure Laterality Date   CARDIOVERSION N/A 02/10/2021   Procedure: CARDIOVERSION;  Surgeon: Antonieta Iba, MD;  Location: [facility] ORS;  Service: Cardiovascular;  Laterality: N/A;   TEE WITHOUT CARDIOVERSION N/A 02/10/2021   Procedure: TRANSESOPHAGEAL ECHOCARDIOGRAM (TEE);  Surgeon: Antonieta Iba, MD;  Location: [facility] ORS;  Service: Cardiovascular;  Laterality: N/A;    Family History  Problem Relation Age of Onset    Hypertension Mother    Hypertension Father    Stroke Father    Heart disease Maternal Grandfather    Heart disease Paternal Grandmother    Hypertension Sister    Breast cancer Paternal Aunt 68       contact   Cancer Neg Hx    Diabetes Neg Hx    COPD Neg Hx     Social History   Socioeconomic History   Marital status: Married    Spouse name: Not on file   Number of children: Not on file   Years of education: Not on file   Highest education level: Not on file  Occupational History   Not on file  Tobacco Use   Smoking status: Former   Smokeless tobacco: Never  Vaping Use   Vaping Use: Never used  Substance and Sexual Activity   Alcohol use: Not Currently   Drug use: Never   Sexual activity: Yes    Partners: Male    Birth control/protection: Pill  Other Topics Concern   Not on file  Social History Narrative   Not on file   Social Determinants of Health   Financial Resource Strain: Not on file  Food Insecurity: Not on file  Transportation Needs: Not on file  Physical Activity: Not on file  Stress: Not on file  Social Connections: Not on file  Intimate Partner Violence: Not on file     Current Outpatient Medications:    apixaban (ELIQUIS) 5 MG TABS tablet, TAKE 1 TABLET BY MOUTH TWICE A DAY, Disp: 60 tablet, Rfl: 5   flecainide (TAMBOCOR) 100 MG tablet, Take 1 tablet (100  mg total) by mouth 2 (two) times daily., Disp: 180 tablet, Rfl: 1   furosemide (LASIX) 20 MG tablet, Take 20 mg by mouth daily as needed. For shortness of breath (Patient not taking: Reported on 02/20/2022), Disp: , Rfl:    metoprolol tartrate (LOPRESSOR) 25 MG tablet, Take 1 tablet (25 mg total) by mouth 2 (two) times daily., Disp: 180 tablet, Rfl: 0   norethindrone (MICRONOR) 0.35 MG tablet, Take 1 tablet (0.35 mg total) by mouth daily., Disp: 84 tablet, Rfl: 0   potassium chloride (MICRO-K) 10 MEQ CR capsule, Take 10 mEq by mouth daily as needed. When taking the furosemide (lasix) (Patient not  taking: Reported on 02/20/2022), Disp: , Rfl:      ROS:  Review of Systems BREAST: No symptoms    Objective: There were no vitals taken for this visit.   OBGyn Exam  Results: No results found for this or any previous visit (from the past 24 hour(s)).  Assessment/Plan:  No diagnosis found.   No orders of the defined types were placed in this encounter.           GYN counsel {counseling: 16159}    F/U  No follow-ups on file.  Brayant Dorr B. Jakya Dovidio, PA-C 05/07/2022 6:03 PM

## 2022-05-08 ENCOUNTER — Ambulatory Visit (INDEPENDENT_AMBULATORY_CARE_PROVIDER_SITE_OTHER): Payer: BC Managed Care – PPO | Admitting: Obstetrics and Gynecology

## 2022-05-08 ENCOUNTER — Other Ambulatory Visit (HOSPITAL_COMMUNITY)
Admission: RE | Admit: 2022-05-08 | Discharge: 2022-05-08 | Disposition: A | Discharge status: Home / Self Care | Payer: BC Managed Care – PPO | Source: Ambulatory Visit | Attending: Obstetrics | Admitting: Obstetrics

## 2022-05-08 ENCOUNTER — Encounter: Payer: Self-pay | Admitting: Obstetrics and Gynecology

## 2022-05-08 VITALS — BP 118/60 | Ht 69.0 in | Wt 218.0 lb

## 2022-05-08 DIAGNOSIS — Z1151 Encounter for screening for human papillomavirus (HPV): Secondary | ICD-10-CM | POA: Insufficient documentation

## 2022-05-08 DIAGNOSIS — Z01419 Encounter for gynecological examination (general) (routine) without abnormal findings: Secondary | ICD-10-CM | POA: Diagnosis not present

## 2022-05-08 DIAGNOSIS — N6322 Unspecified lump in the left breast, upper inner quadrant: Secondary | ICD-10-CM

## 2022-05-08 DIAGNOSIS — Z124 Encounter for screening for malignant neoplasm of cervix: Secondary | ICD-10-CM | POA: Diagnosis present

## 2022-05-08 DIAGNOSIS — Z1231 Encounter for screening mammogram for malignant neoplasm of breast: Secondary | ICD-10-CM

## 2022-05-08 DIAGNOSIS — Z3041 Encounter for surveillance of contraceptive pills: Secondary | ICD-10-CM

## 2022-05-08 DIAGNOSIS — N951 Menopausal and female climacteric states: Secondary | ICD-10-CM

## 2022-05-08 DIAGNOSIS — Z1211 Encounter for screening for malignant neoplasm of colon: Secondary | ICD-10-CM

## 2022-05-08 NOTE — Patient Instructions (Addendum)
I value your feedback and you entrusting us with your care. If you get a [place] [place] patient survey, I would appreciate you taking the time to let us know about your experience today. Thank you!  [facility]: [phone]      

## 2022-05-10 LAB — CYTOLOGY - PAP
Comment: NEGATIVE
Diagnosis: NEGATIVE
Diagnosis: REACTIVE
High risk HPV: NEGATIVE

## 2022-05-22 LAB — COLOGUARD: COLOGUARD: NEGATIVE

## 2022-06-19 ENCOUNTER — Ambulatory Visit
Admission: RE | Admit: 2022-06-19 | Discharge: 2022-06-19 | Disposition: A | Discharge status: Home / Self Care | Payer: BC Managed Care – PPO | Source: Ambulatory Visit | Attending: Obstetrics and Gynecology | Admitting: Obstetrics and Gynecology

## 2022-06-19 ENCOUNTER — Encounter: Payer: Self-pay | Admitting: Radiology

## 2022-06-19 DIAGNOSIS — Z1231 Encounter for screening mammogram for malignant neoplasm of breast: Secondary | ICD-10-CM | POA: Diagnosis present

## 2022-07-18 ENCOUNTER — Other Ambulatory Visit: Payer: Self-pay | Admitting: Obstetrics and Gynecology

## 2022-07-18 DIAGNOSIS — Z0000 Encounter for general adult medical examination without abnormal findings: Secondary | ICD-10-CM

## 2022-07-18 DIAGNOSIS — Z3041 Encounter for surveillance of contraceptive pills: Secondary | ICD-10-CM

## 2022-07-24 ENCOUNTER — Other Ambulatory Visit: Payer: Self-pay | Admitting: Cardiovascular Disease

## 2022-08-05 ENCOUNTER — Encounter: Payer: Self-pay | Admitting: Cardiovascular Disease

## 2022-08-07 ENCOUNTER — Telehealth: Payer: Self-pay | Admitting: Cardiovascular Disease

## 2022-08-07 NOTE — Telephone Encounter (Signed)
Left voicemail. Need to schedule appt from recall with gollan or app.

## 2022-08-11 ENCOUNTER — Ambulatory Visit: Payer: BC Managed Care – PPO | Admitting: Cardiovascular Disease

## 2022-08-13 ENCOUNTER — Other Ambulatory Visit: Payer: Self-pay | Admitting: Cardiovascular Disease

## 2022-10-14 NOTE — Progress Notes (Unsigned)
Cardiology Office Note  Date:  10/16/2022   ID:  Courtney Walsh, DOB September 05, 1970, MRN [id number]  PCP:  Marjie Skiff, NP   Chief Complaint  Patient presents with   Follow-up    6 Month f/u c/o occasional indigestion/heart burn. Meds reviewed verbally with pt.    HPI:  [name] is a 52 year old woman with past medical history of Remote smoking hx, stopped 30 years ago atrial fibrillation CHA2DS2-VASc score 3 (chf, htn, gender) In the hospital January 2023 for atrial fibrillation treated with diltiazem infusion Requiring TEE and cardioversion, on Eliquis 5 twice daily Cardiomyopathy EF 40 to 45%, in atrial fib Unable to add ACE, ARB Entresto given low blood pressure at this time Who presents for follow-up of her atrial fibrillation and PVC  Last seen in clinic by myself February 2024 Denies significant paroxysmal tachycardia concerning for arrhythmia Remains on flecainide 100 twice daily with metoprolol tartrate 25 twice daily  Minimal lower extremity edema Working long hours in retail, opening stores, [employer] No time off this past summer Blood pressure well-controlled No regular exercise program Has not required Lasix/potassium  Prior event monitor  6% burden of PVCs,   Prior ejection fraction mildly depressed in the setting of atrial fibrillation, 40-45 percent No repeat echo in normal sinus rhythm, previously declined  EKG personally reviewed by myself on todays visit EKG Interpretation Date/Time:  Monday October 16 2022 09:56:01 EDT Ventricular Rate:  61 PR Interval:  152 QRS Duration:  94 QT Interval:  406 QTC Calculation: 408 R Axis:   -8  Text Interpretation: Normal sinus rhythm Minimal voltage criteria for LVH, may be normal variant ( R in aVL ) Nonspecific ST and T wave abnormality When compared with ECG of 10-Feb-2021 08:17, PREVIOUS ECG IS PRESENT Confirmed by Julien Nordmann [phone]) on 10/16/2022 10:10:02 AM    Hospital records  reviewed Transesophageal echo /carotid vision February 10, 2021, in atrial fibrillation  1. Left ventricular ejection fraction, by estimation, is 40 to 45%. The  left ventricle has mildly decreased function. The left ventricle has no  regional wall motion abnormalities.   2. Right ventricular systolic function is normal. The right ventricular  size is normal.   3. Left atrial size was moderately dilated. No left atrial/left atrial  appendage thrombus was detected.   4. The mitral valve is normal in structure. Mild mitral valve  regurgitation.   PMH:   has a past medical history of Afib (HCC), Hypertension, Irregular heart beat, Overweight, and Thyroid cyst (10/2013).  PSH:    Past Surgical History:  Procedure Laterality Date   CARDIOVERSION N/A 02/10/2021   Procedure: CARDIOVERSION;  Surgeon: Antonieta Iba, MD;  Location: [facility] ORS;  Service: Cardiovascular;  Laterality: N/A;   TEE WITHOUT CARDIOVERSION N/A 02/10/2021   Procedure: TRANSESOPHAGEAL ECHOCARDIOGRAM (TEE);  Surgeon: Antonieta Iba, MD;  Location: [facility] ORS;  Service: Cardiovascular;  Laterality: N/A;    Current Outpatient Medications  Medication Sig Dispense Refill   apixaban (ELIQUIS) 5 MG TABS tablet TAKE 1 TABLET BY MOUTH TWICE A DAY 60 tablet 5   flecainide (TAMBOCOR) 100 MG tablet TAKE 1 TABLET BY MOUTH TWICE A DAY 180 tablet 0   furosemide (LASIX) 20 MG tablet Take 20 mg by mouth daily as needed. For shortness of breath     metoprolol tartrate (LOPRESSOR) 25 MG tablet TAKE 1 TABLET BY MOUTH TWICE A DAY 180 tablet 0   potassium chloride (MICRO-K) 10 MEQ CR capsule Take 10 mEq by mouth  daily as needed. When taking the furosemide (lasix)     No current facility-administered medications for this visit.    Allergies:   Patient has no known allergies.   Social History:  The patient  reports that she has quit smoking. She has never used smokeless tobacco. She reports that she does not currently use alcohol. She  reports that she does not use drugs.   Family History:   family history includes Breast cancer (age of onset: 60) in her paternal aunt; Heart disease in her maternal grandfather and paternal grandmother; Hypertension in her father, mother, and sister; Stroke in her father.    Review of Systems: Review of Systems  Constitutional: Negative.   HENT: Negative.    Respiratory: Negative.    Cardiovascular: Negative.   Gastrointestinal: Negative.   Musculoskeletal: Negative.   Neurological: Negative.   Psychiatric/Behavioral: Negative.    All other systems reviewed and are negative.   PHYSICAL EXAM: VS:  BP 128/84 (BP Location: Left Arm, Patient Position: Sitting, Cuff Size: Large)   Pulse 61   Ht 5\' 9"  (1.753 m)   Wt 223 lb (101.2 kg)   SpO2 98%   BMI 32.93 kg/m  , BMI Body mass index is 32.93 kg/m. Constitutional:  oriented to person, place, and time. No distress.  HENT:  Head: Grossly normal Eyes:  no discharge. No scleral icterus.  Neck: No JVD, no carotid bruits  Cardiovascular: Regular rate and rhythm, no murmurs appreciated Pulmonary/Chest: Clear to auscultation bilaterally, no wheezes or rails Abdominal: Soft.  no distension.  no tenderness.  Musculoskeletal: Normal range of motion Neurological:  normal muscle tone. Coordination normal. No atrophy Skin: Skin warm and dry Psychiatric: normal affect, pleasant  Recent Labs: No results found for requested labs within last 365 days.    Lipid Panel Lab Results  Component Value Date   CHOL 179 01/23/2017   HDL 43 01/23/2017   LDLCALC 84 01/23/2017   TRIG 258 (H) 01/23/2017    Wt Readings from Last 3 Encounters:  10/16/22 223 lb (101.2 kg)  05/08/22 218 lb (98.9 kg)  02/20/22 216 lb 4 oz (98.1 kg)     ASSESSMENT AND PLAN:  Problem List Items Addressed This Visit       Cardiology Problems   Dilated cardiomyopathy (HCC)   Relevant Orders   EKG 12-Lead (Completed)   Essential hypertension, benign   Relevant  Orders   EKG 12-Lead (Completed)   Atrial fibrillation with RVR (HCC) - Primary   Relevant Orders   EKG 12-Lead (Completed)     Other   Obesity   Relevant Orders   EKG 12-Lead (Completed)   Other Visit Diagnoses     PVC (premature ventricular contraction)       Relevant Orders   EKG 12-Lead (Completed)   Premature ventricular contractions       Relevant Orders   EKG 12-Lead (Completed)      Atrial fibrillation, persistent Maintain normal sinus rhythm Continue Eliquis 5 twice daily with metoprolol 25 twice daily, flecainide 100 twice daily Routine lab work  Frequent PVCs Continue flecainide with metoprolol, asymptomatic 6 percent burden on monitor  Shortness of breath Denies significant shortness of breath Prior EF 40 to 45% in atrial fibrillation, no new baseline available Declining repeat echo secondary to cost Lasix as needed for ankle swelling or shortness of breath symptoms  Cardiomyopathy Ejection fraction 40 -45% while in atrial fibrillation, presumed to be tachycardia mediated Recommend repeat echocardiogram to estimate ejection fraction She  will call us back if she would like to schedule this  Essential hypertension Continue metoprolol tartrate twice daily Blood pressure stable  Weight gain Low carbohydrate and walking program    Total encounter time more than 30 minutes  Greater than 50% was spent in counseling and coordination of care with the patient    Signed, Dossie Arbour, M.D., Ph.D. [facility] [place], [place] [phone]

## 2022-10-16 ENCOUNTER — Ambulatory Visit: Payer: BC Managed Care – PPO | Attending: Cardiovascular Disease | Admitting: Cardiovascular Disease

## 2022-10-16 ENCOUNTER — Encounter: Payer: Self-pay | Admitting: Cardiovascular Disease

## 2022-10-16 VITALS — BP 128/84 | HR 61 | Ht 69.0 in | Wt 223.0 lb

## 2022-10-16 DIAGNOSIS — I4891 Unspecified atrial fibrillation: Secondary | ICD-10-CM

## 2022-10-16 DIAGNOSIS — I420 Dilated cardiomyopathy: Secondary | ICD-10-CM

## 2022-10-16 DIAGNOSIS — I10 Essential (primary) hypertension: Secondary | ICD-10-CM | POA: Diagnosis not present

## 2022-10-16 DIAGNOSIS — E66811 Obesity, class 1: Secondary | ICD-10-CM

## 2022-10-16 DIAGNOSIS — I493 Ventricular premature depolarization: Secondary | ICD-10-CM

## 2022-10-16 DIAGNOSIS — E6609 Other obesity due to excess calories: Secondary | ICD-10-CM

## 2022-10-16 DIAGNOSIS — Z6830 Body mass index (BMI) 30.0-30.9, adult: Secondary | ICD-10-CM

## 2022-10-16 DIAGNOSIS — Z79899 Other long term (current) drug therapy: Secondary | ICD-10-CM

## 2022-10-16 MED ORDER — METOPROLOL TARTRATE 25 MG PO TABS: 25.0000 mg | ORAL_TABLET | Freq: Two times a day (BID) | ORAL | 3 refills | Status: DC

## 2022-10-16 MED ORDER — FLECAINIDE ACETATE 100 MG PO TABS: 100.0000 mg | ORAL_TABLET | Freq: Two times a day (BID) | ORAL | 3 refills | Status: DC

## 2022-10-16 NOTE — Patient Instructions (Addendum)
Medication Instructions:  Your Physician recommend you continue on your current medication as directed.    *If you need a refill on your cardiac medications before your next appointment, please call your pharmacy*   Lab Work: Your provider would like for you to have following labs drawn today CBC and BMP.   If you have labs (blood work) drawn today and your tests are completely normal, you will receive your results only by: MyChart Message (if you have MyChart) OR A paper copy in the mail If you have any lab test that is abnormal or we need to change your treatment, we will call you to review the results.   Follow-Up: At [facility], you and your health needs are our priority.  As part of our continuing mission to provide you with exceptional heart care, we have created designated Provider Care Teams.  These Care Teams include your primary Cardiologist (physician) and Advanced Practice Providers (APPs -  Physician Assistants and Nurse Practitioners) who all work together to provide you with the care you need, when you need it.  We recommend signing up for the patient portal called "MyChart".  Sign up information is provided on this After Visit Summary.  MyChart is used to connect with patients for Virtual Visits (Telemedicine).  Patients are able to view lab/test results, encounter notes, upcoming appointments, etc.  Non-urgent messages can be sent to your provider as well.   To learn more about what you can do with MyChart, go to [email or web address].     Follow-Up: At [facility], you and your health needs are our priority.  As part of our continuing mission to provide you with exceptional heart care, we have created designated Provider Care Teams.  These Care Teams include your primary Cardiologist (physician) and Advanced Practice Providers (APPs -  Physician Assistants and Nurse Practitioners) who all work together to provide you with the care you need, when you need  it.  You will need a follow up appointment in 12 months  Providers on your designated Care Team:   Nicolasa Ducking, NP Eula Listen, PA-C Cadence Fransico [place], [place]  COVID-19 Vaccine Information can be found at: [email or web address] For questions related to vaccine distribution or appointments, please email [email or web address] or call [phone].

## 2022-10-17 LAB — CBC
Hematocrit: 44.2 % (ref 34.0–46.6)
Hemoglobin: 14.5 g/dL (ref 11.1–15.9)
MCH: 30.1 pg (ref 26.6–33.0)
MCHC: 32.8 g/dL (ref 31.5–35.7)
MCV: 92 fL (ref 79–97)
Platelets: 228 10*3/uL (ref 150–450)
RBC: 4.81 x10E6/uL (ref 3.77–5.28)
RDW: 13 % (ref 11.7–15.4)
WBC: 7.5 10*3/uL (ref 3.4–10.8)

## 2022-10-17 LAB — BASIC METABOLIC PANEL
BUN/Creatinine Ratio: 14 (ref 9–23)
BUN: 9 mg/dL (ref 6–24)
CO2: 24 mmol/L (ref 20–29)
Calcium: 9.8 mg/dL (ref 8.7–10.2)
Chloride: 102 mmol/L (ref 96–106)
Creatinine, Ser: 0.63 mg/dL (ref 0.57–1.00)
Glucose: 88 mg/dL (ref 70–99)
Potassium: 4.3 mmol/L (ref 3.5–5.2)
Sodium: 141 mmol/L (ref 134–144)
eGFR: 105 mL/min/{1.73_m2} (ref >59)

## 2022-11-02 ENCOUNTER — Other Ambulatory Visit: Payer: Self-pay | Admitting: Cardiovascular Disease

## 2022-11-02 DIAGNOSIS — I4891 Unspecified atrial fibrillation: Secondary | ICD-10-CM

## 2022-11-02 NOTE — Telephone Encounter (Signed)
Prescription refill request for Eliquis received. Indication: AF Last office visit: 10/16/22  Concha Se MD Scr: 0.63 on 10/16/22  Epic Age: 52 Weight: 101.2kg  Based on above findings Eliquis 5mg  twice daily is the appropriate dose.  Refill approved.

## 2023-02-10 IMAGING — MG MM DIGITAL SCREENING BILAT W/ TOMO AND CAD
8 series · 8 of 24 positions shown · non-contrast
Comparison: Previous exam(s).

CLINICAL DATA: Screening.

EXAM:
DIGITAL SCREENING BILATERAL MAMMOGRAM WITH TOMOSYNTHESIS AND CAD
TECHNIQUE: Bilateral screening digital craniocaudal and mediolateral oblique
mammograms were obtained. Bilateral screening digital breast
tomosynthesis was performed. The images were evaluated with
computer-aided detection.

[L CC synth-2D]
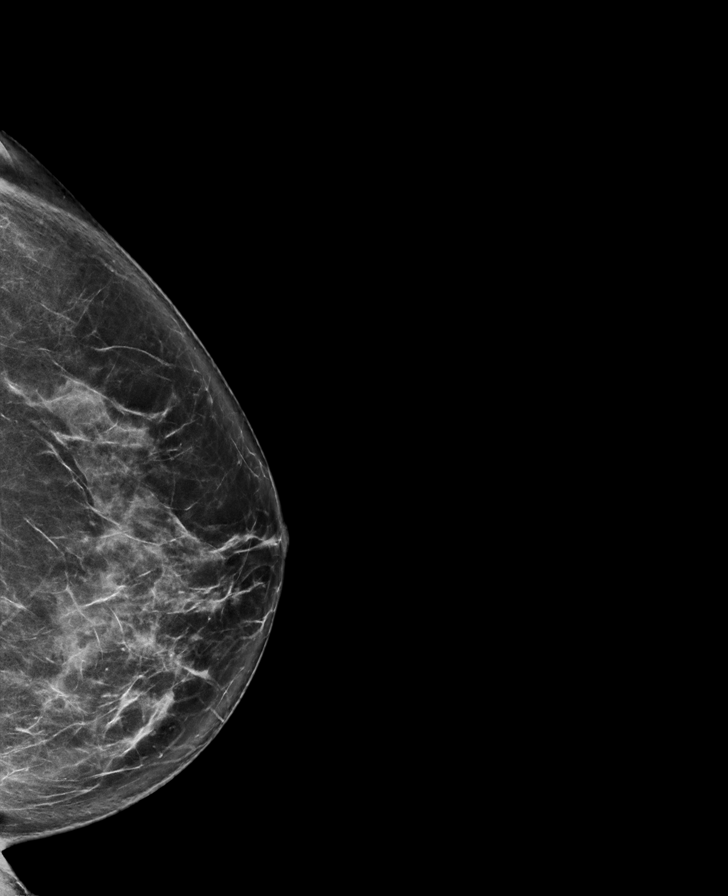

[R MLO synth-2D]
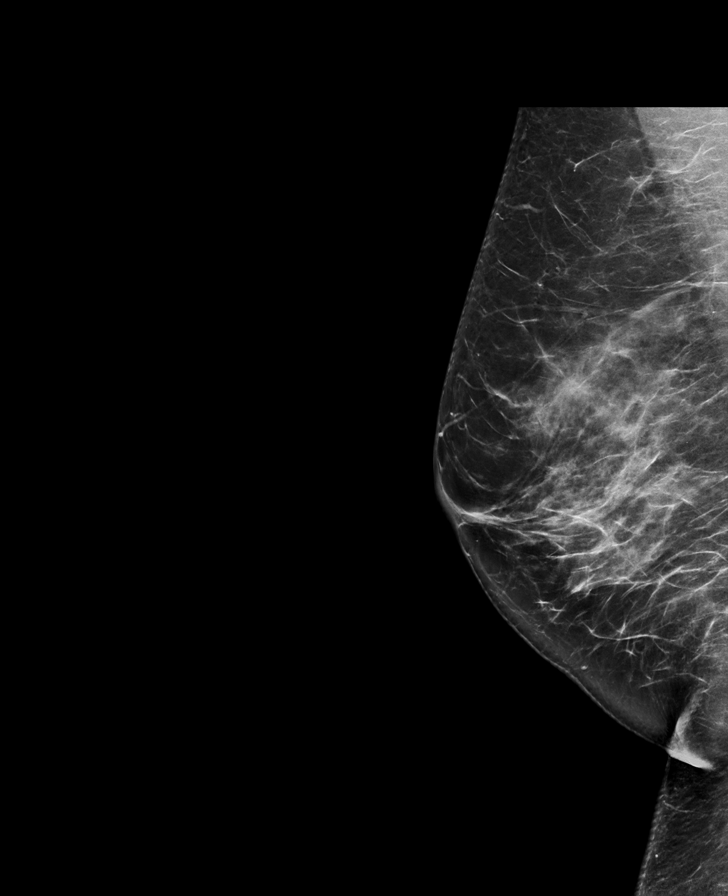

[L MLO synth-2D]
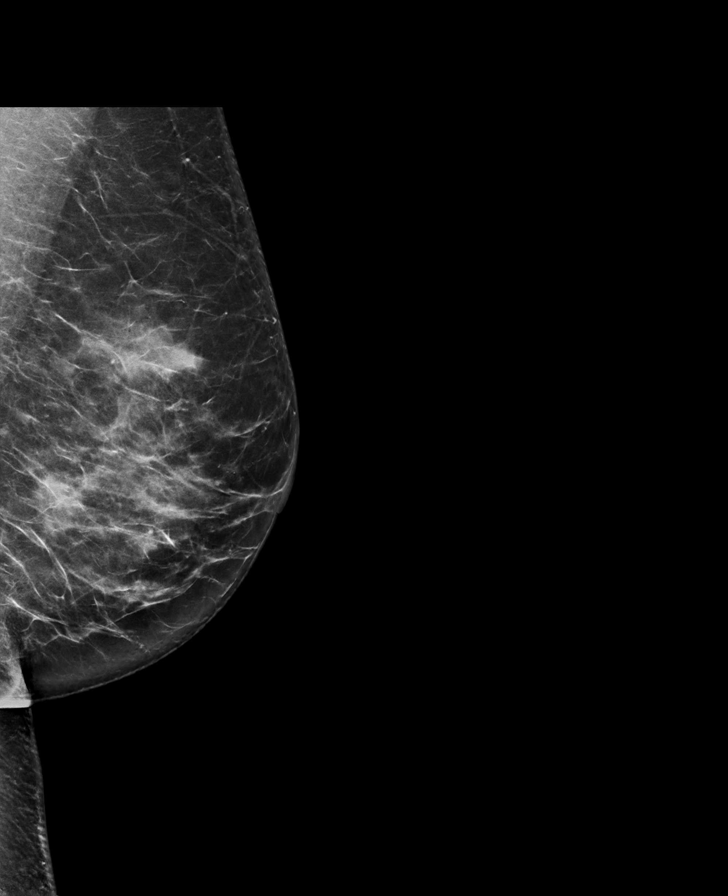

[R CC synth-2D]
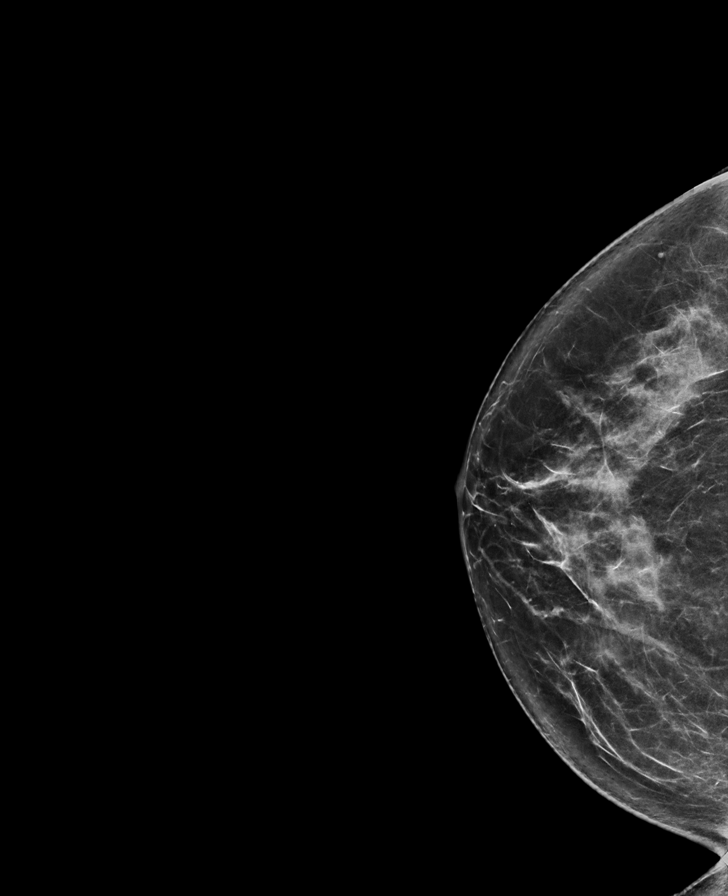

[R MLO tomo · tomo slice 42/83.0]
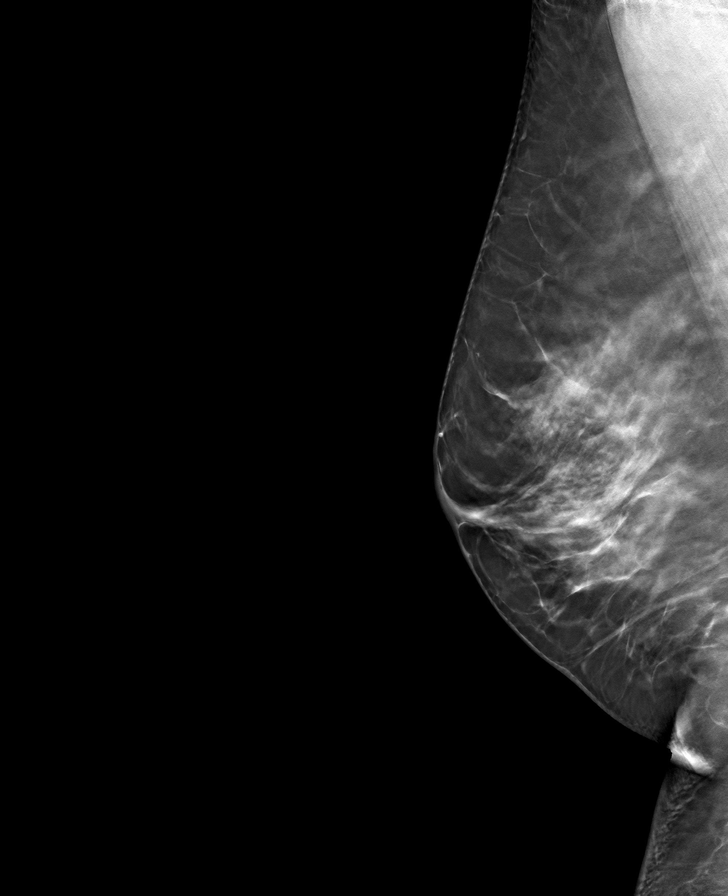

[L MLO tomo · tomo slice 39/77.0]
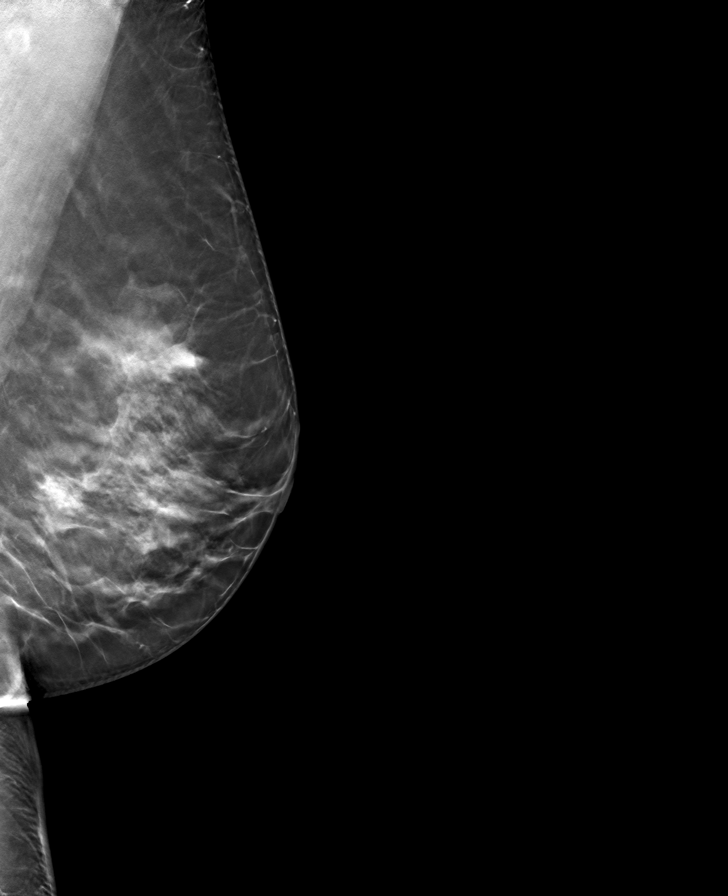

[L CC tomo · tomo slice 39/77.0]
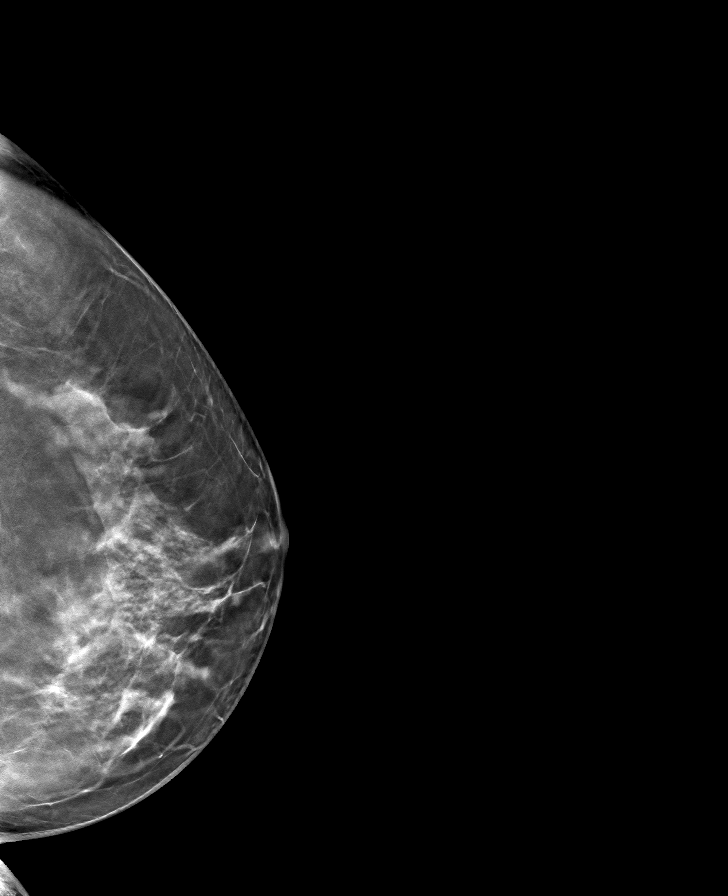

[R CC tomo · tomo slice 39/77.0]
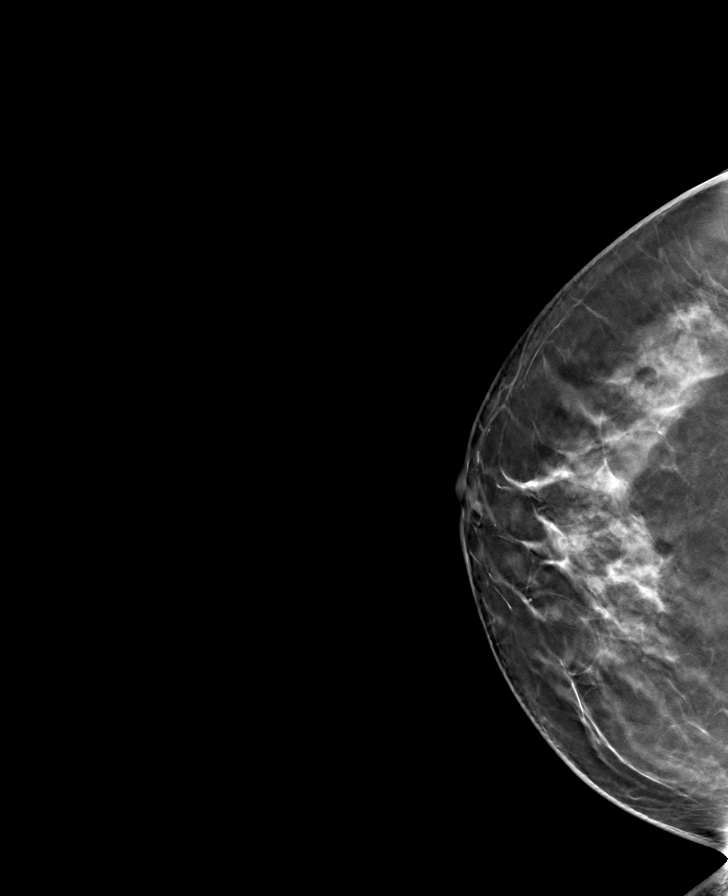

[8 of 24 positions shown; findings below may reference images not displayed]

ACR Breast Density Category c: The breast tissue is heterogeneously
dense, which may obscure small masses.
FINDINGS: There are no findings suspicious for malignancy.
IMPRESSION: No mammographic evidence of malignancy. A result letter of this
screening mammogram will be mailed directly to the patient.

RECOMMENDATION:
Screening mammogram in one year. (Code:Q3-W-BC3)

BI-RADS CATEGORY  1: Negative.

## 2023-03-24 ENCOUNTER — Encounter: Payer: Self-pay | Admitting: Cardiovascular Disease

## 2023-06-06 ENCOUNTER — Other Ambulatory Visit: Payer: Self-pay | Admitting: Cardiovascular Disease

## 2023-06-06 DIAGNOSIS — I4891 Unspecified atrial fibrillation: Secondary | ICD-10-CM

## 2023-06-06 NOTE — Telephone Encounter (Signed)
 Refill request

## 2023-06-06 NOTE — Telephone Encounter (Signed)
 Pt last saw Dr Jerelene Monday 10/16/22, last labs 10/16/22 Creat 0.63, age 53, weight 101.2kg, based on specified criteria pt is on appropriate dosage of Eliquis  5mg  BID for afib.  Will refill rx.

## 2023-10-04 ENCOUNTER — Other Ambulatory Visit: Payer: Self-pay | Admitting: Cardiovascular Disease

## 2023-10-04 ENCOUNTER — Encounter: Payer: Self-pay | Admitting: Cardiovascular Disease

## 2023-10-16 ENCOUNTER — Telehealth: Payer: Self-pay | Admitting: Cardiovascular Disease

## 2023-10-16 NOTE — Telephone Encounter (Signed)
 Patient says she is returning a call, but no one left a message. Please advise.

## 2023-11-23 ENCOUNTER — Other Ambulatory Visit: Payer: Self-pay | Admitting: Cardiovascular Disease

## 2023-11-23 DIAGNOSIS — I4891 Unspecified atrial fibrillation: Secondary | ICD-10-CM

## 2023-12-02 NOTE — Progress Notes (Unsigned)
 Cardiology Office Note  Date:  12/03/2023   ID:  Courtney Walsh, DOB [birth year], MRN [id number]  PCP:  Valerio Melanie DASEN, NP   Chief Complaint  Patient presents with   12 month follow up     Doing well.     HPI:  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 Denies significant tachycardia or palpitations concerning for atrial fibrillation or PVCs  Tolerating flecainide  100 twice daily In 2023 we tried flecainide  50 twice daily but had frequent PVCs and A-fib spells, on the 100 mg twice daily dosing has done better Also metoprolol  tartrate 25 twice daily, has not had to take extra  No significant PND, orthopnea, no lower extremity edema, no chest pain or shortness of breath on exertion  Blood pressure high end of the range today Has not required Lasix  and potassium  Prior event monitor  6% burden of PVCs, on flecainide  50 twice daily Since then dosing was raised up to 100 twice daily  Echo in atrial fibrillation, 40-45 percent  EKG personally reviewed by myself on todays visit EKG Interpretation Date/Time:  Monday December 03 2023 08:05:51 EST Ventricular Rate:  57 PR Interval:  142 QRS Duration:  96 QT Interval:  418 QTC Calculation: 406 R Axis:   -4  Text Interpretation: Sinus bradycardia Nonspecific ST and T wave abnormality When compared with ECG of 16-Oct-2022 09:56, No significant change was found Confirmed by Perla Lye  ([id number]) on 12/03/2023 8:08:23 AM   Hospital records reviewed Transesophageal echo /carotid vision February 10, 2021, in atrial fibrillation  1. Left ventricular ejection fraction, by estimation, is 40 to 45%. The  left ventricle has mildly decreased function. The left ventricle has no  regional wall motion abnormalities.   2. Right ventricular systolic function is normal. The right ventricular  size is normal.   3. Left atrial size was moderately dilated. No left atrial/left atrial  appendage thrombus was detected.   4. The mitral valve is normal in structure. Mild mitral valve  regurgitation.   PMH:   has a past medical history of Afib (HCC), Hypertension, Irregular heart beat, Overweight, and Thyroid  cyst (10/2013).  PSH:    Past Surgical History:  Procedure Laterality Date   CARDIOVERSION N/A 02/10/2021   Procedure: CARDIOVERSION;  Surgeon: Perla Lye PARAS, MD;  Location: [facility] ORS;  Service: Cardiovascular;  Laterality: N/A;   TEE WITHOUT CARDIOVERSION N/A 02/10/2021   Procedure: TRANSESOPHAGEAL ECHOCARDIOGRAM (TEE);  Surgeon: Tahlor Berenguer J, MD;  Location: [facility] ORS;  Service: Cardiovascular;  Laterality: N/A;    Current Outpatient Medications  Medication Sig Dispense Refill   ELIQUIS  5 MG TABS tablet TAKE 1 TABLET BY MOUTH TWICE A DAY 60 tablet 5   flecainide  (TAMBOCOR ) 100 MG tablet Take 1 tablet (100 mg total) by mouth 2 (two) times daily. 180 tablet 3   furosemide  (LASIX ) 20 MG tablet Take 20 mg by mouth daily as needed. For shortness of breath     metoprolol  tartrate (LOPRESSOR ) 25 MG  tablet Take 1 tablet (25 mg total) by mouth 2 (two) times daily. 180 tablet 3   potassium chloride  (MICRO-K ) 10 MEQ CR capsule Take 10 mEq by mouth daily as needed. When taking the furosemide  (lasix )     No current facility-administered medications for this visit.    Allergies:   Patient has no known allergies.   Social History:  The patient  reports that she has quit smoking. She has  never used smokeless tobacco. She reports that she does not currently use alcohol. She reports that she does not use drugs.   Family History:   family history includes Breast cancer (age of onset: 54) in her paternal aunt; Heart disease in her maternal grandfather and paternal grandmother; Hypertension in her father, mother, and sister; Stroke in her father.    Review of Systems: Review of Systems  Constitutional: Negative.   HENT: Negative.    Respiratory: Negative.    Cardiovascular: Negative.   Gastrointestinal: Negative.   Musculoskeletal: Negative.   Neurological: Negative.   Psychiatric/Behavioral: Negative.    All other systems reviewed and are negative.   PHYSICAL EXAM: VS:  BP (!) 140/90 (BP Location: Left Arm, Patient Position: Sitting, Cuff Size: Normal)   Pulse (!) 57   Ht 5' 4 (1.626 m)   Wt 226 lb 4 oz (102.6 kg)   SpO2 99%   BMI 38.84 kg/m  , BMI Body mass index is 38.84 kg/m. Constitutional:  oriented to person, place, and time. No distress.  HENT:  Head: Grossly normal Eyes:  no discharge. No scleral icterus.  Neck: No JVD, no carotid bruits  Cardiovascular: Regular rate and rhythm, no murmurs appreciated Pulmonary/Chest: Clear to auscultation bilaterally, no wheezes or rails Abdominal: Soft.  no distension.  no tenderness.  Musculoskeletal: Normal range of motion Neurological:  normal muscle tone. Coordination normal. No atrophy Skin: Skin warm and dry Psychiatric: normal affect, pleasant  Recent Labs: No results found for requested labs within last 365 days.    Lipid Panel Lab Results  Component Value Date   CHOL 179 01/23/2017   HDL 43 01/23/2017   LDLCALC 84 01/23/2017   TRIG 258 (H) 01/23/2017    Wt Readings from Last 3 Encounters:  12/03/23 226 lb 4 oz (102.6 kg)  10/16/22 223 lb (101.2 kg)  05/08/22 218 lb (98.9 kg)     ASSESSMENT AND PLAN:  Problem List Items Addressed This Visit       Cardiology Problems   Dilated  cardiomyopathy (HCC)   Relevant Orders   EKG 12-Lead (Completed)   Essential hypertension, benign   Relevant Orders   EKG 12-Lead (Completed)   Atrial fibrillation with RVR (HCC) - Primary   Relevant Orders   EKG 12-Lead (Completed)     Other   Obesity   Other Visit Diagnoses       PVC (premature ventricular contraction)       Relevant Orders   EKG 12-Lead (Completed)     Premature ventricular contractions           Atrial fibrillation, persistent Maintaining normal sinus rhythm on current regiment flecainide  100 twice daily metoprolol  tartrate 25 twice daily Tolerating Eliquis  5 twice daily Routine lab work today, none in 2025  Frequent PVCs Continue flecainide  at current dose with metoprolol ,  Asymptomatic  Shortness of breath Prior EF 40 to 45% in atrial fibrillation,  No baseline in normal sinus rhythm  Previously declining repeat echo secondary to cost Lasix  as needed  Cardiomyopathy Ejection fraction  40 -45% while in atrial fibrillation, presumed to be tachycardia mediated Appears euvolemic, previously declined repeat study  Essential hypertension Continue metoprolol  tartrate twice daily Blood pressure high end of range, recommend close monitoring at home  Weight gain We have encouraged continued exercise, careful diet management    Signed, Velinda Lunger, M.D., Ph.D. [facility] [place], [place] [phone]

## 2023-12-03 ENCOUNTER — Ambulatory Visit: Attending: Cardiovascular Disease | Admitting: Cardiovascular Disease

## 2023-12-03 ENCOUNTER — Encounter: Payer: Self-pay | Admitting: Cardiovascular Disease

## 2023-12-03 VITALS — BP 140/90 | HR 57 | Ht 64.0 in | Wt 226.2 lb

## 2023-12-03 DIAGNOSIS — Z6830 Body mass index (BMI) 30.0-30.9, adult: Secondary | ICD-10-CM

## 2023-12-03 DIAGNOSIS — I420 Dilated cardiomyopathy: Secondary | ICD-10-CM

## 2023-12-03 DIAGNOSIS — E66811 Obesity, class 1: Secondary | ICD-10-CM

## 2023-12-03 DIAGNOSIS — I10 Essential (primary) hypertension: Secondary | ICD-10-CM | POA: Diagnosis not present

## 2023-12-03 DIAGNOSIS — Z79899 Other long term (current) drug therapy: Secondary | ICD-10-CM

## 2023-12-03 DIAGNOSIS — I4891 Unspecified atrial fibrillation: Secondary | ICD-10-CM

## 2023-12-03 DIAGNOSIS — E6609 Other obesity due to excess calories: Secondary | ICD-10-CM

## 2023-12-03 DIAGNOSIS — I493 Ventricular premature depolarization: Secondary | ICD-10-CM

## 2023-12-03 NOTE — Patient Instructions (Addendum)
 Medication Instructions:  No changes  If you need a refill on your cardiac medications before your next appointment, please call your pharmacy.   Lab work: No new labs needed  Testing/Procedures: Labs today: CBC, CMP, lipids  Follow-Up: At [facility], you and your health needs are our priority.  As part of our continuing mission to provide you with exceptional heart care, we have created designated Provider Care Teams.  These Care Teams include your primary Cardiologist (physician) and Advanced Practice Providers (APPs -  Physician Assistants and Nurse Practitioners) who all work together to provide you with the care you need, when you need it.  You will need a follow up appointment in 12 months  Providers on your designated Care Team:   Lonni Meager, NP Bernardino Bring, PA-C Cadence [place], [place]  COVID-19 Vaccine Information can be found at: [email or web address] For questions related to vaccine distribution or appointments, please email [email or web address] or call [phone].

## 2023-12-04 LAB — CBC
Hematocrit: 46.1 % (ref 34.0–46.6)
Hemoglobin: 14.6 g/dL (ref 11.1–15.9)
MCH: 28.7 pg (ref 26.6–33.0)
MCHC: 31.7 g/dL (ref 31.5–35.7)
MCV: 91 fL (ref 79–97)
Platelets: 237 x10E3/uL (ref 150–450)
RBC: 5.09 x10E6/uL (ref 3.77–5.28)
RDW: 13 % (ref 11.7–15.4)
WBC: 8.3 x10E3/uL (ref 3.4–10.8)

## 2023-12-04 LAB — COMPREHENSIVE METABOLIC PANEL WITH GFR
ALT: 22 IU/L (ref 0–32)
AST: 21 IU/L (ref 0–40)
Albumin: 4.1 g/dL (ref 3.8–4.9)
Alkaline Phosphatase: 145 IU/L — ABNORMAL HIGH (ref 49–135)
BUN/Creatinine Ratio: 17 (ref 9–23)
BUN: 12 mg/dL (ref 6–24)
Bilirubin Total: 0.6 mg/dL (ref 0.0–1.2)
CO2: 24 mmol/L (ref 20–29)
Calcium: 9.7 mg/dL (ref 8.7–10.2)
Chloride: 104 mmol/L (ref 96–106)
Creatinine, Ser: 0.7 mg/dL (ref 0.57–1.00)
Globulin, Total: 3 g/dL (ref 1.5–4.5)
Glucose: 93 mg/dL (ref 70–99)
Potassium: 5 mmol/L (ref 3.5–5.2)
Sodium: 142 mmol/L (ref 134–144)
Total Protein: 7.1 g/dL (ref 6.0–8.5)
eGFR: 102 mL/min/1.73 (ref >59)

## 2023-12-04 LAB — LIPID PANEL
Chol/HDL Ratio: 3.4 ratio (ref 0.0–4.4)
Cholesterol, Total: 179 mg/dL (ref 100–199)
HDL: 52 mg/dL (ref >39)
LDL Chol Calc (NIH): 98 mg/dL (ref 0–99)
Triglycerides: 167 mg/dL — ABNORMAL HIGH (ref 0–149)
VLDL Cholesterol Cal: 29 mg/dL (ref 5–40)

## 2023-12-09 ENCOUNTER — Ambulatory Visit: Payer: Self-pay | Admitting: Cardiovascular Disease

## 2023-12-11 ENCOUNTER — Other Ambulatory Visit: Payer: Self-pay

## 2023-12-11 ENCOUNTER — Encounter: Payer: Self-pay | Admitting: Cardiovascular Disease

## 2023-12-11 MED ORDER — METOPROLOL TARTRATE 25 MG PO TABS: 25.0000 mg | ORAL_TABLET | Freq: Two times a day (BID) | ORAL | 3 refills | Status: AC

## 2024-02-05 ENCOUNTER — Encounter: Payer: Self-pay | Admitting: Cardiovascular Disease

## 2024-02-06 MED ORDER — FLECAINIDE ACETATE 100 MG PO TABS: 100.0000 mg | ORAL_TABLET | Freq: Two times a day (BID) | ORAL | 3 refills | Status: AC
